# Patient Record
Sex: Female | Born: 1970 | Race: White | Hispanic: No | Marital: Married | Smoking: Never smoker
Health system: Southern US, Community
[De-identification: ages and names within clinical notes are randomized; demographics above are authoritative.]

## PROBLEM LIST (undated history)

## (undated) DIAGNOSIS — IMO0002 Reserved for concepts with insufficient information to code with codable children: Secondary | ICD-10-CM

## (undated) DIAGNOSIS — G35 Multiple sclerosis: Secondary | ICD-10-CM

## (undated) DIAGNOSIS — Z8739 Personal history of other diseases of the musculoskeletal system and connective tissue: Secondary | ICD-10-CM

## (undated) DIAGNOSIS — M329 Systemic lupus erythematosus, unspecified: Secondary | ICD-10-CM

## (undated) HISTORY — PX: TEMPOROMANDIBULAR JOINT SURGERY: SHX35

## (undated) HISTORY — PX: CERVICAL FUSION: SHX112

---

## 2017-07-03 ENCOUNTER — Emergency Department (HOSPITAL_COMMUNITY): Payer: Medicaid Other

## 2017-07-03 ENCOUNTER — Other Ambulatory Visit: Payer: Self-pay

## 2017-07-03 ENCOUNTER — Encounter (HOSPITAL_COMMUNITY): Payer: Self-pay

## 2017-07-03 ENCOUNTER — Emergency Department (HOSPITAL_COMMUNITY)
Admission: EM | Admit: 2017-07-03 | Discharge: 2017-07-03 | Disposition: A | Discharge status: Home / Self Care | Payer: Medicaid Other | Attending: Emergency Medicine | Admitting: Emergency Medicine

## 2017-07-03 DIAGNOSIS — Z79899 Other long term (current) drug therapy: Secondary | ICD-10-CM | POA: Diagnosis not present

## 2017-07-03 DIAGNOSIS — M5431 Sciatica, right side: Secondary | ICD-10-CM | POA: Insufficient documentation

## 2017-07-03 DIAGNOSIS — M79604 Pain in right leg: Secondary | ICD-10-CM | POA: Diagnosis present

## 2017-07-03 HISTORY — DX: Systemic lupus erythematosus, unspecified: M32.9

## 2017-07-03 HISTORY — DX: Reserved for concepts with insufficient information to code with codable children: IMO0002

## 2017-07-03 HISTORY — DX: Personal history of other diseases of the musculoskeletal system and connective tissue: Z87.39

## 2017-07-03 LAB — COMPREHENSIVE METABOLIC PANEL
ALT: 16 U/L (ref 14–54)
ANION GAP: 7 (ref 5–15)
AST: 19 U/L (ref 15–41)
Albumin: 4.2 g/dL (ref 3.5–5.0)
Alkaline Phosphatase: 42 U/L (ref 38–126)
BUN: 11 mg/dL (ref 6–20)
CALCIUM: 9.4 mg/dL (ref 8.9–10.3)
CHLORIDE: 108 mmol/L (ref 101–111)
CO2: 25 mmol/L (ref 22–32)
Creatinine, Ser: 0.55 mg/dL (ref 0.44–1.00)
Glucose, Bld: 93 mg/dL (ref 65–99)
Potassium: 3.4 mmol/L — ABNORMAL LOW (ref 3.5–5.1)
SODIUM: 140 mmol/L (ref 135–145)
Total Bilirubin: 1.3 mg/dL — ABNORMAL HIGH (ref 0.3–1.2)
Total Protein: 7.3 g/dL (ref 6.5–8.1)

## 2017-07-03 LAB — CBC WITH DIFFERENTIAL/PLATELET
Basophils Absolute: 0 10*3/uL (ref 0.0–0.1)
Basophils Relative: 0 %
EOS ABS: 0.1 10*3/uL (ref 0.0–0.7)
EOS PCT: 1 %
HCT: 39.7 % (ref 36.0–46.0)
Hemoglobin: 13.4 g/dL (ref 12.0–15.0)
LYMPHS ABS: 1.8 10*3/uL (ref 0.7–4.0)
Lymphocytes Relative: 31 %
MCH: 29.3 pg (ref 26.0–34.0)
MCHC: 33.8 g/dL (ref 30.0–36.0)
MCV: 86.9 fL (ref 78.0–100.0)
MONO ABS: 0.5 10*3/uL (ref 0.1–1.0)
MONOS PCT: 8 %
Neutro Abs: 3.5 10*3/uL (ref 1.7–7.7)
Neutrophils Relative %: 60 %
PLATELETS: 304 10*3/uL (ref 150–400)
RBC: 4.57 MIL/uL (ref 3.87–5.11)
RDW: 12.4 % (ref 11.5–15.5)
WBC: 5.8 10*3/uL (ref 4.0–10.5)

## 2017-07-03 MED ORDER — CYCLOBENZAPRINE HCL 10 MG PO TABS: 10.0000 mg | ORAL_TABLET | Freq: Three times a day (TID) | ORAL | 0 refills | Status: AC | PRN

## 2017-07-03 MED ORDER — LIDOCAINE 5 % EX PTCH: 1.0000 | MEDICATED_PATCH | CUTANEOUS | 0 refills | Status: DC

## 2017-07-03 MED ORDER — HYDROCODONE-ACETAMINOPHEN 5-325 MG PO TABS: 1.0000 | ORAL_TABLET | Freq: Four times a day (QID) | ORAL | 0 refills | Status: DC | PRN

## 2017-07-03 MED ORDER — DIAZEPAM 5 MG/ML IJ SOLN
5.0000 mg | Freq: Once | INTRAMUSCULAR | Status: AC
  Administered 2017-07-03: 5 mg via INTRAVENOUS
  Filled 2017-07-03: qty 2

## 2017-07-03 MED ORDER — DICLOFENAC SODIUM 1 % TD GEL: 2.0000 g | Freq: Four times a day (QID) | TRANSDERMAL | 0 refills | Status: AC

## 2017-07-03 MED ORDER — HYDROMORPHONE HCL 1 MG/ML IJ SOLN
1.0000 mg | Freq: Once | INTRAMUSCULAR | Status: AC
  Administered 2017-07-03: 1 mg via INTRAVENOUS
  Filled 2017-07-03: qty 1

## 2017-07-03 NOTE — ED Triage Notes (Signed)
Pt reports 10/10 right leg pain x6 months. Pt reports that she has been evaluated by her PCP and referred for an MRI, but was denied due to having Medicaid. Pt reports Rx meds have not help relieve her pain. Pt reports utiliizing cane for ambulatory assistance. Pt A+OX4, tearful, speaking in complete sentences.

## 2017-07-03 NOTE — ED Provider Notes (Signed)
[facility]-EMERGENCY DEPT Provider Note   CSN: [id number] Arrival date & time: 07/03/17  1551     History   Chief Complaint Chief Complaint  Patient presents with  . Leg Pain    Right    HPI Suzanne Fisher is a 46 y.o. female history of lupus on Plaquenil, here presenting with back pain, right leg numbness.  Patient states that she has been having lower back pain for the last 4-6 months.  States that it initially started in her lower back and now started to radiate to her right leg.  Patient states that over the last month, the numbness in the right leg got worse and she has pain every time she bears weight on the leg.  She has to use a cane to help her walk due to pain.  States that whenever she coughs it hurts.  She denies trouble urinating or weakness.  She has seen her primary care doctor and was prescribed gabapentin, tramadol with no relief of her pain.  She had 3 cortisone injections but was unable to control her pain either.  She denies any fevers or chills. 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   The history is provided by the patient.    Past Medical History:  Diagnosis Date  . H/O degenerative disc disease   . Lupus     There are no active problems to display for this patient.   History reviewed. No pertinent surgical history.  OB History    No data available       Home Medications    Prior to Admission medications   Medication Sig Start Date End Date Taking? Authorizing Provider  buPROPion (WELLBUTRIN XL) 150 MG 24 hr tablet TAKE 1 TABLET BY MOUTH EVERY DAY IN THE MORNING 04/12/17  Yes [provider]  fluocinonide (LIDEX) 0.05 % external solution Apply 1 application topically daily.  04/16/17 04/16/18 Yes [provider]  gabapentin (NEURONTIN) 300 MG capsule Take 300 mg by mouth 3 (three) times daily. 05/21/17  Yes [provider]  traMADol (ULTRAM-ER) 100 MG 24 hr tablet Take  100 mg by mouth daily as needed for pain.   Yes [provider]  traZODone (DESYREL) 100 MG tablet TAKE 1 TABLET BY MOUTH EVERY DAY AT NIGHT 04/12/17  Yes [provider]  diclofenac (VOLTAREN) 75 MG EC tablet TAKE 1 TABLET BY MOUTH TWICE A DAY FOR 14 DAYS 06/18/17   [provider]  DULoxetine (CYMBALTA) 60 MG capsule Take by mouth. 04/12/17   [provider]  HYDROcodone-acetaminophen (NORCO/VICODIN) 5-325 MG tablet Take 1 tablet by mouth every 6 (six) hours as needed. for pain 06/06/17   [provider]  HYSINGLA ER 40 MG T24A TAKE 1 TABLET BY MOUTH EVERY 24 HOURS AS NEEDED FOR PAIN 06/18/17   [provider]  ibuprofen (ADVIL,MOTRIN) 600 MG tablet TAKE ONE TABLET BY MOUTH EVERY 6 HOURS NOT TO EXCEED 3200MG  PER DAY 06/06/17   [provider]  ibuprofen (ADVIL,MOTRIN) 800 MG tablet Take 800 mg by mouth every 8 (eight) hours. 05/24/17   [provider]  omeprazole (PRILOSEC) 40 MG capsule TAKE 1 CAPSULE BY MOUTH EVERY DAY IN THE MORNING 30 MINUTES BEFORE BREAKFAST 04/08/17   [provider]  oxyCODONE-acetaminophen (PERCOCET) 7.5-325 MG tablet TAKE 1 TABLET BY MOUTH EVERY 4 TO 6 HOURS AS NEEDED 06/18/17   [provider]  traMADol (ULTRAM) 50 MG tablet TAKE 1/2 -  2 TABLETS BY MOUTH EVERY 8 - 12 HOURS AS NEEDED FOR BREAKTHROUGH PAIN 06/25/17   [provider]    Family History History reviewed. No pertinent family history.  Social History Social History   Tobacco Use  . Smoking status: Never Smoker  . Smokeless tobacco: Never Used  Substance Use Topics  . Alcohol use: No    Frequency: Never  . Drug use: No     Allergies   Patient has no known allergies.   Review of Systems Review of Systems  Musculoskeletal: Positive for back pain.  All other systems reviewed and are negative.    Physical Exam Updated Vital Signs BP 118/77 (BP Location: Right Arm)   Pulse 76   Temp 98.1 F (36.7  C) (Oral)   Resp 18   SpO2 99%   Physical Exam  Constitutional: She is oriented to person, place, and time.  Uncomfortable   HENT:  Head: Normocephalic.  Mouth/Throat: Oropharynx is clear and moist.  Bell's palsy R side (chronic per patient)  Eyes: Conjunctivae and EOM are normal. Pupils are equal, round, and reactive to light.  Neck: Normal range of motion. Neck supple.  Cardiovascular: Normal rate, regular rhythm and normal heart sounds.  Pulmonary/Chest: Effort normal and breath sounds normal. No stridor. No respiratory distress.  Abdominal: Soft. Bowel sounds are normal. She exhibits no distension. There is no tenderness. There is no guarding.  Musculoskeletal: Normal range of motion.  Neurological: She is alert and oriented to person, place, and time.  Dec sensation lateral aspect R leg. No saddle anesthesia. + straight leg raise R side. Difficult to assess strength on the R leg due to pain. Strength and sensation nl L leg   Skin: Skin is warm.  Psychiatric: She has a normal mood and affect.  Nursing note and vitals reviewed.    ED Treatments / Results  Labs (all labs ordered are listed, but only abnormal results are displayed) Labs Reviewed  COMPREHENSIVE METABOLIC PANEL - Abnormal; Notable for the following components:      Result Value   Potassium 3.4 (*)    Total Bilirubin 1.3 (*)    All other components within normal limits  CBC WITH DIFFERENTIAL/PLATELET    EKG  EKG Interpretation None       Radiology Mr Lumbar Spine Wo Contrast  Result Date: 07/03/2017 CLINICAL DATA:  Back pain radiating to the right lower extremity EXAM: MRI LUMBAR SPINE WITHOUT CONTRAST TECHNIQUE: Multiplanar, multisequence MR imaging of the lumbar spine was performed. No intravenous contrast was administered. COMPARISON:  None. FINDINGS: Segmentation:  Standard. Alignment:  Physiologic. Vertebrae:  No fracture, evidence of discitis, or bone lesion. Conus medullaris and cauda equina:  Conus extends to the L1 level. Conus and cauda equina appear normal. Paraspinal and other soft tissues: Normal visualized retroperitoneal and paraspinal soft tissues. Disc levels: At L4-L5, there is a central disc protrusion that narrows the left lateral recess. There is no central spinal canal or right lateral recess stenosis. There is mild narrowing of the left neural foramen. The other visualized disc spaces, from T11-T12 down, are normal. IMPRESSION: 1. No spinal canal stenosis or finding to explain the reported right-sided radiculopathy. 2. Small central/left subarticular L4-L5 disc protrusion narrowing the left lateral recess, which could serve as a source of left L5 radiculopathy. There is also mild left foraminal stenosis at this level, which could cause left L4 radiculopathy. Electronically Signed   By: Deatra RobinsonKevin  Herman M.D.   On: 07/03/2017 21:59  Procedures Procedures (including critical care time)  Medications Ordered in ED Medications  HYDROmorphone (DILAUDID) injection 1 mg (1 mg Intravenous Given 07/03/17 2105)  diazepam (VALIUM) injection 5 mg (5 mg Intravenous Given 07/03/17 2106)     Initial Impression / Assessment and Plan / ED Course  I have reviewed the triage vital signs and the nursing notes.  Pertinent labs & imaging results that were available during my care of the patient were reviewed by me and considered in my medical decision making (see chart for details).     Suzanne Fisher is a 46 y.o. female here with R leg pain, numbness, back pain. Likely sciatica on the right side. She has some decreased sensation lateral aspect R leg so will get MRI lumbar spine to assess. Will give pain meds, muscle relaxants   10:20 PM Labs unremarkable. MRI showed L4-L5 disc protrusion on the left side. However, her symptoms are on the right side. Her pain improved with pain meds. She has neurosurgery follow up on 12/26. Will dc home with vicodin, flexeril, prn diclofenac or lidocaine  patch. I encouraged her to request a disc to be reviewed by her surgeon. Had tried steroid pack before and it didn't help so doesn't want another course of steroids.   Final Clinical Impressions(s) / ED Diagnoses   Final diagnoses:  None    ED Discharge Orders    None       Charlynne PanderYao, David Hsienta, MD 07/03/17 2223

## 2017-07-03 NOTE — ED Notes (Signed)
Rn To collect labs.

## 2017-07-03 NOTE — ED Notes (Signed)
Patient transported to MRI 

## 2017-07-03 NOTE — Discharge Instructions (Signed)
Take motrin for pain.   Take vicodin for severe pain.   Take flexeril for muscle spasms.   Try diclofenac or lidocaine patch for pain.   Call medical records to request a disc and show it to your surgeon.   See your primary care doctor  Return to ER if you have worse back pain, numbness, weakness, trouble urinating.

## 2017-08-25 ENCOUNTER — Emergency Department (HOSPITAL_COMMUNITY): Payer: Medicaid Other

## 2017-08-25 ENCOUNTER — Encounter (HOSPITAL_COMMUNITY): Payer: Self-pay

## 2017-08-25 ENCOUNTER — Other Ambulatory Visit: Payer: Self-pay

## 2017-08-25 ENCOUNTER — Emergency Department (HOSPITAL_COMMUNITY)
Admission: EM | Admit: 2017-08-25 | Discharge: 2017-08-25 | Disposition: A | Discharge status: Home / Self Care | Payer: Medicaid Other | Attending: Emergency Medicine | Admitting: Emergency Medicine

## 2017-08-25 DIAGNOSIS — Z79899 Other long term (current) drug therapy: Secondary | ICD-10-CM | POA: Diagnosis not present

## 2017-08-25 DIAGNOSIS — B349 Viral infection, unspecified: Secondary | ICD-10-CM

## 2017-08-25 DIAGNOSIS — R42 Dizziness and giddiness: Secondary | ICD-10-CM | POA: Diagnosis present

## 2017-08-25 HISTORY — DX: Multiple sclerosis: G35

## 2017-08-25 LAB — COMPREHENSIVE METABOLIC PANEL
ALK PHOS: 46 U/L (ref 38–126)
ALT: 16 U/L (ref 14–54)
AST: 22 U/L (ref 15–41)
Albumin: 3.7 g/dL (ref 3.5–5.0)
Anion gap: 10 (ref 5–15)
BILIRUBIN TOTAL: 0.6 mg/dL (ref 0.3–1.2)
BUN: 12 mg/dL (ref 6–20)
CALCIUM: 8.8 mg/dL — AB (ref 8.9–10.3)
CO2: 28 mmol/L (ref 22–32)
CREATININE: 0.72 mg/dL (ref 0.44–1.00)
Chloride: 101 mmol/L (ref 101–111)
GFR calc non Af Amer: >60 mL/min (ref >60)
Glucose, Bld: 109 mg/dL — ABNORMAL HIGH (ref 65–99)
Potassium: 3.2 mmol/L — ABNORMAL LOW (ref 3.5–5.1)
Sodium: 139 mmol/L (ref 135–145)
Total Protein: 7.3 g/dL (ref 6.5–8.1)

## 2017-08-25 LAB — CBC WITH DIFFERENTIAL/PLATELET
Basophils Absolute: 0 10*3/uL (ref 0.0–0.1)
Basophils Relative: 0 %
EOS PCT: 0 %
Eosinophils Absolute: 0 10*3/uL (ref 0.0–0.7)
HCT: 40.3 % (ref 36.0–46.0)
HEMOGLOBIN: 13.2 g/dL (ref 12.0–15.0)
LYMPHS ABS: 1.2 10*3/uL (ref 0.7–4.0)
LYMPHS PCT: 26 %
MCH: 29.1 pg (ref 26.0–34.0)
MCHC: 32.8 g/dL (ref 30.0–36.0)
MCV: 88.8 fL (ref 78.0–100.0)
Monocytes Absolute: 0.7 10*3/uL (ref 0.1–1.0)
Monocytes Relative: 15 %
Neutro Abs: 2.6 10*3/uL (ref 1.7–7.7)
Neutrophils Relative %: 59 %
PLATELETS: 214 10*3/uL (ref 150–400)
RBC: 4.54 MIL/uL (ref 3.87–5.11)
RDW: 12.6 % (ref 11.5–15.5)
WBC: 4.4 10*3/uL (ref 4.0–10.5)

## 2017-08-25 LAB — I-STAT TROPONIN, ED: TROPONIN I, POC: 0 ng/mL (ref 0.00–0.08)

## 2017-08-25 LAB — I-STAT CG4 LACTIC ACID, ED: LACTIC ACID, VENOUS: 1.42 mmol/L (ref 0.5–1.9)

## 2017-08-25 LAB — URINALYSIS, ROUTINE W REFLEX MICROSCOPIC
BILIRUBIN URINE: NEGATIVE
Glucose, UA: NEGATIVE mg/dL
Ketones, ur: NEGATIVE mg/dL
Leukocytes, UA: NEGATIVE
Nitrite: NEGATIVE
PH: 5 (ref 5.0–8.0)
Protein, ur: NEGATIVE mg/dL
SPECIFIC GRAVITY, URINE: 1.014 (ref 1.005–1.030)

## 2017-08-25 LAB — TSH: TSH: 1.702 u[IU]/mL (ref 0.350–4.500)

## 2017-08-25 MED ORDER — SODIUM CHLORIDE 0.9 % IV BOLUS (SEPSIS)
1000.0000 mL | Freq: Once | INTRAVENOUS | Status: AC
  Administered 2017-08-25: 1000 mL via INTRAVENOUS

## 2017-08-25 MED ORDER — BENZONATATE 100 MG PO CAPS: 100.0000 mg | ORAL_CAPSULE | Freq: Three times a day (TID) | ORAL | 0 refills | Status: AC

## 2017-08-25 MED ORDER — ONDANSETRON HCL 4 MG PO TABS: 4.0000 mg | ORAL_TABLET | Freq: Three times a day (TID) | ORAL | 0 refills | Status: AC | PRN

## 2017-08-25 MED ORDER — POTASSIUM CHLORIDE CRYS ER 20 MEQ PO TBCR
40.0000 meq | EXTENDED_RELEASE_TABLET | Freq: Once | ORAL | Status: AC
  Administered 2017-08-25: 40 meq via ORAL
  Filled 2017-08-25: qty 2

## 2017-08-25 MED ORDER — BENZONATATE 100 MG PO CAPS
100.0000 mg | ORAL_CAPSULE | Freq: Once | ORAL | Status: AC
  Administered 2017-08-25: 100 mg via ORAL
  Filled 2017-08-25: qty 1

## 2017-08-25 NOTE — ED Triage Notes (Signed)
Pt reports dx of pneumonia in January. Pt reports tx with abx. Pt reports that she "never really recovered" [facility]. Pt states " they did a xray and saw fluid on the right" Pt reports they administered abx and cough meds. Pt reports that she has been feeling dizzy and weak this morning. Pt reports she feels faint.

## 2017-08-25 NOTE — ED Provider Notes (Signed)
[facility]-EMERGENCY DEPT Provider Note   CSN: [id number] Arrival date & time: 08/25/17  1439     History   Chief Complaint Chief Complaint  Patient presents with  . Dizziness  . Weakness    HPI Suzanne Fisher is a 47 y.o. female.  HPI   47 year old female with history of lupus, multiple sclerosis who was diagnosed with pneumonia in January presenting here complaining of dizzy and weakness.  Patient report a month ago she was diagnosed with influenza.  She was on medication for that.  She got mildly better but for the past 2 weeks symptom has progressively become worse.  She endorsed chills, headache, dry cough, sinus congestion, decrease in appetite, loss of taste buds, generalized weakness, fatigue, having dizziness and lightheadedness.  States her cough is causing a lot of chest pain and upper back pain.  She is having difficulty sleeping, and has no appetite.  She was seen at the urgent care yesterday for her symptoms.  States that a repeat flu test is negative, strep test was negative, and a chest x-ray performed and patient was told that she had fluid in her lung.  She was given antibiotic, and cough medication.  States that her symptoms did not improve and she feels much worse today.  Admits to history of lupus and currently on Plaquenil, also admits to history of MS but has not had a flare in quite a while.  No recent travel, no prior history of PE or DVT.  No urinary complaint.  No bowel bladder complaint.  Past Medical History:  Diagnosis Date  . H/O degenerative disc disease   . Lupus   . MS (multiple sclerosis) (HCC)     There are no active problems to display for this patient.   Past Surgical History:  Procedure Laterality Date  . CERVICAL FUSION    . CESAREAN SECTION    . TEMPOROMANDIBULAR JOINT SURGERY      OB History    No data available       Home Medications    Prior to Admission medications   Medication Sig Start Date End  Date Taking? Authorizing Provider  buPROPion (WELLBUTRIN XL) 150 MG 24 hr tablet TAKE 1 TABLET BY MOUTH EVERY DAY IN THE MORNING 04/12/17   [provider]  cyclobenzaprine (FLEXERIL) 10 MG tablet Take 1 tablet (10 mg total) by mouth 3 (three) times daily as needed for muscle spasms. 07/03/17   Charlynne Pander, MD  diclofenac (VOLTAREN) 75 MG EC tablet TAKE 1 TABLET BY MOUTH TWICE A DAY FOR 14 DAYS 06/18/17   [provider]  diclofenac sodium (VOLTAREN) 1 % GEL Apply 2 g topically 4 (four) times daily. 07/03/17   Charlynne Pander, MD  DULoxetine (CYMBALTA) 60 MG capsule Take by mouth. 04/12/17   [provider]  fluocinonide (LIDEX) 0.05 % external solution Apply 1 application topically daily.  04/16/17 04/16/18  [provider]  gabapentin (NEURONTIN) 300 MG capsule Take 300 mg by mouth 3 (three) times daily. 05/21/17   [provider]  HYDROcodone-acetaminophen (NORCO/VICODIN) 5-325 MG tablet Take 1 tablet by mouth every 6 (six) hours as needed. for pain 06/06/17   [provider]  HYDROcodone-acetaminophen (NORCO/VICODIN) 5-325 MG tablet Take 1 tablet by mouth every 6 (six) hours as needed. 07/03/17   Charlynne Pander, MD  [facility] ER 40 MG T24A TAKE 1 TABLET BY MOUTH EVERY 24 HOURS AS NEEDED FOR PAIN 06/18/17   [provider]  ibuprofen (ADVIL,MOTRIN) 600 MG tablet TAKE ONE TABLET BY MOUTH EVERY 6 HOURS NOT TO EXCEED 3200MG  PER DAY 06/06/17   [provider]  ibuprofen (ADVIL,MOTRIN) 800 MG tablet Take 800 mg by mouth every 8 (eight) hours. 05/24/17   [provider]  lidocaine (LIDODERM) 5 % Place 1 patch onto the skin daily. Remove & Discard patch within 12 hours or as directed by MD 07/03/17   Charlynne Pander, MD  omeprazole (PRILOSEC) 40 MG capsule TAKE 1 CAPSULE BY MOUTH EVERY DAY IN THE MORNING 30 MINUTES BEFORE BREAKFAST 04/08/17   [provider]  oxyCODONE-acetaminophen (PERCOCET) 7.5-325 MG  tablet TAKE 1 TABLET BY MOUTH EVERY 4 TO 6 HOURS AS NEEDED 06/18/17   [provider]  traMADol (ULTRAM) 50 MG tablet TAKE 1/2 - 2 TABLETS BY MOUTH EVERY 8 - 12 HOURS AS NEEDED FOR BREAKTHROUGH PAIN 06/25/17   [provider]  traMADol (ULTRAM-ER) 100 MG 24 hr tablet Take 100 mg by mouth daily as needed for pain.    [provider]  traZODone (DESYREL) 100 MG tablet TAKE 1 TABLET BY MOUTH EVERY DAY AT NIGHT 04/12/17   [provider]    Family History History reviewed. No pertinent family history.  Social History Social History   Tobacco Use  . Smoking status: Never Smoker  . Smokeless tobacco: Never Used  Substance Use Topics  . Alcohol use: No    Frequency: Never  . Drug use: No     Allergies   Patient has no known allergies.   Review of Systems Review of Systems  All other systems reviewed and are negative.    Physical Exam Updated Vital Signs BP 103/81 (BP Location: Right Arm)   Pulse (!) 103   Temp 97.6 F (36.4 C) (Oral)   Resp 20   Ht 5\' 4"  (1.626 m)   Wt 51.3 kg (113 lb)   SpO2 100%   BMI 19.40 kg/m    Physical Exam  Constitutional: She is oriented to person, place, and time. She appears well-developed and well-nourished. No distress.  Patient appears deconditioned, talking slowly, moving slowly, but nontoxic  HENT:  Head: Atraumatic.  Ears: Normal TMs bilaterally Nose: Normal nares Throat: Uvula is midline no tonsillar enlargement or exudates, tongue is dry  Eyes: Conjunctivae and EOM are normal. Pupils are equal, round, and reactive to light.  Neck: Normal range of motion. Neck supple.  No nuchal rigidity  Cardiovascular:  Mild tachycardia without murmur rubs gallops  Pulmonary/Chest: Effort normal and breath sounds normal. No stridor. No respiratory distress. She has no wheezes. She has no rales. She exhibits tenderness (Mild tenderness to anterior chest wall on palpation).  Abdominal: Soft. She exhibits no  distension. There is no tenderness.  Musculoskeletal:  Strength are equal throughout with poor effort  Neurological: She is alert and oriented to person, place, and time.  Skin: No rash noted.  Psychiatric: She has a normal mood and affect.  Nursing note and vitals reviewed.    ED Treatments / Results  Labs (all labs ordered are listed, but only abnormal results are displayed) Labs Reviewed  COMPREHENSIVE METABOLIC PANEL - Abnormal; Notable for the following components:      Result Value   Potassium 3.2 (*)    Glucose, Bld 109 (*)    Calcium 8.8 (*)    All other components within normal limits  URINALYSIS, ROUTINE W REFLEX MICROSCOPIC - Abnormal; Notable for the following components:   APPearance HAZY (*)  Hgb urine dipstick SMALL (*)    Bacteria, UA RARE (*)    Squamous Epithelial / LPF 6-30 (*)    All other components within normal limits  CBC WITH DIFFERENTIAL/PLATELET  TSH  T4, FREE  I-STAT CG4 LACTIC ACID, ED  I-STAT TROPONIN, ED    EKG  EKG Interpretation  Date/Time:  Sunday August 25 2017 16:03:10 EST Ventricular Rate:  96 PR Interval:    QRS Duration: 106 QT Interval:  335 QTC Calculation: 424 R Axis:   48 Text Interpretation:  Sinus rhythm Consider anterior infarct Confirmed by Messick, Peter ([id number]) on 08/25/2017 4:18:05 PM       Radiology Dg Chest 2 View  Result Date: 08/25/2017 CLINICAL DATA:  Diagnosed with pneumonia in January. Continued symptoms. EXAM: CHEST  2 VIEW COMPARISON:  None. FINDINGS: The heart, hila, and mediastinum are normal. No pneumothorax. No nodules or masses. No focal infiltrates. IMPRESSION: No active cardiopulmonary disease. Electronically Signed   By: David  Williams III M.D   On: 08/25/2017 18:46    Procedures Procedures (including critical care time)  Medications Ordered in ED Medications  sodium chloride 0.9 % bolus 1,000 mL (0 mLs Intravenous Stopped 08/25/17 1730)  benzonatate (TESSALON) capsule 100 mg (100 mg Oral  Given 08/25/17 1543)  potassium chloride SA (K-DUR,KLOR-CON) CR tablet 40 mEq (40 mEq Oral Given 08/25/17 1723)  sodium chloride 0.9 % bolus 1,000 mL (1,000 mLs Intravenous New Bag/Given 08/25/17 1753)     Initial Impression / Assessment and Plan / ED Course  I have reviewed the triage vital signs and the nursing notes.  Pertinent labs & imaging results that were available during my care of the patient were reviewed by me and considered in my medical decision making (see chart for details).     BP 125/85 (BP Location: Left Arm)   Pulse 97   Temp 97.6 F (36.4 C) (Oral)   Resp 18   Ht 5\' 4" (1.626 m)   Wt 51.3 kg (113 lb)   SpO2 100%   BMI 19.40 kg/m    Final Clinical Impressions(s) / ED Diagnoses   Final diagnoses:  Viral illness    ED Discharge Orders        Ordered    benzonatate (TESSALON) 100 MG capsule  Every 8 hours     08/25/17 1936    ondansetron (ZOFRAN) 4 MG tablet  Every 8 hours PRN     02 /03/19 1936     3:22 PM Patient here due to lightheadedness and generalized weakness.  She was treated for the flu a month ago and now being treated for potential pneumonia with antibiotic.  Her symptoms seems to be related to her recent sickness.  She is mildly tachycardic.  I have low suspicion for PE causing her symptoms.  Symptoms not consistent with ACS.  Workup initiated, will provide symptomatic treatment.  5:30 PM Patient remains tachycardic despite receiving 1 L of IV fluid.  She report no improvement.  She does have history of thyroid disease, will check thyroid level, will continue with IV fluid hydration.  Her labs otherwise reassuring, mild hypokalemia, potassium supplementation given.  7:10 PM EKG and troponins are reassuring, urinalysis without signs of urinary tract infection, lactic acid level is normal, normal TSH, normal WBC as well as H&H, aside from mild hypokalemia, however electrolyte panels are reassuring, chest x-ray without any evidence of fluid collection  or pneumonia.  Patient's tachycardia has resolved with IV fluid.  Patient felt better.  Encourage patient  to follow-up outpatient with her PCP for further management.  Return precautions discussed.  Fayrene Helper, PA-C 08/25/17 1938  Wynetta Fines, MD 08/25/17 [phone]

## 2017-08-25 NOTE — ED Notes (Signed)
Pt. Unable to urinate at this time. Will collect urine when pt.voids. Nurse aware. 

## 2017-08-26 LAB — T4, FREE: Free T4: 1 ng/dL (ref 0.61–1.12)

## 2018-12-08 IMAGING — MR MR LUMBAR SPINE W/O CM
4 of 5 series · 19 of 48 positions shown · non-contrast
Comparison: None.

CLINICAL DATA: Back pain radiating to the right lower extremity

EXAM:
MRI LUMBAR SPINE WITHOUT CONTRAST
TECHNIQUE: Multiplanar, multisequence MR imaging of the lumbar spine was
performed. No intravenous contrast was administered.

[Series 3: T1 · sagittal · 4.0mm · 0.51mm/px · 3 of 12 slices shown (1 of 2)]
[im 3/12]
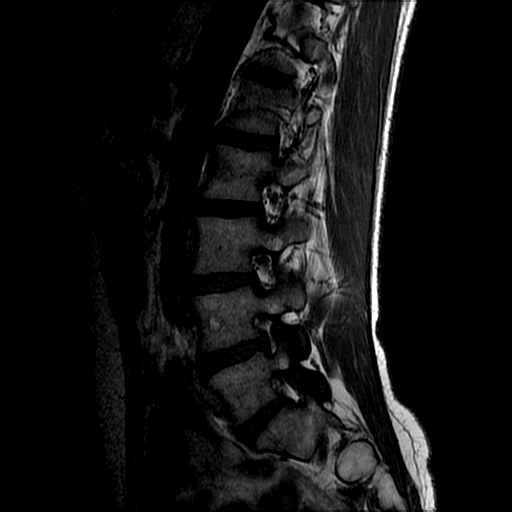
[im 7/12]
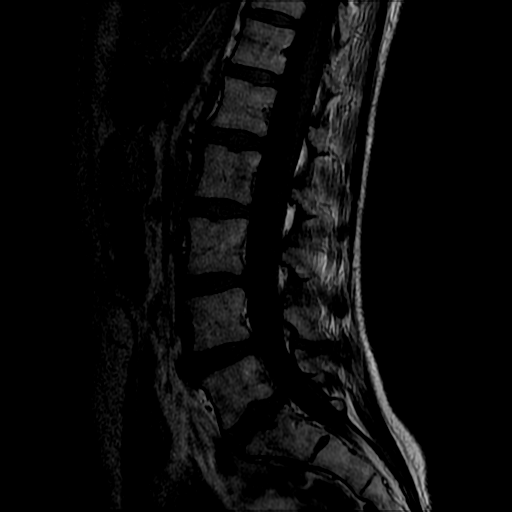
[im 12/12]
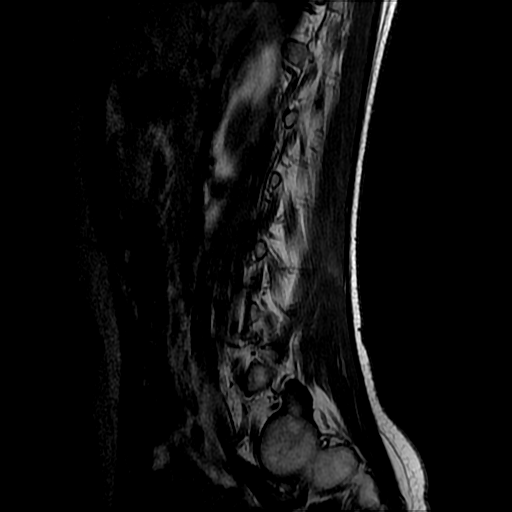

[Series 4: T2 · sagittal · 4.0mm · 0.51mm/px · 6 of 12 slices shown (1 of 2)]
[im 1/12]
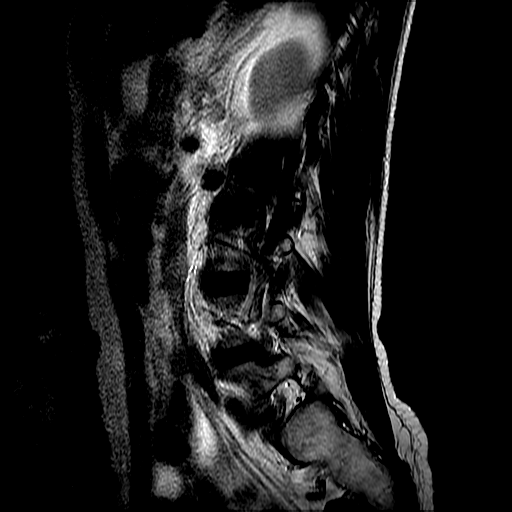
[im 3/12]
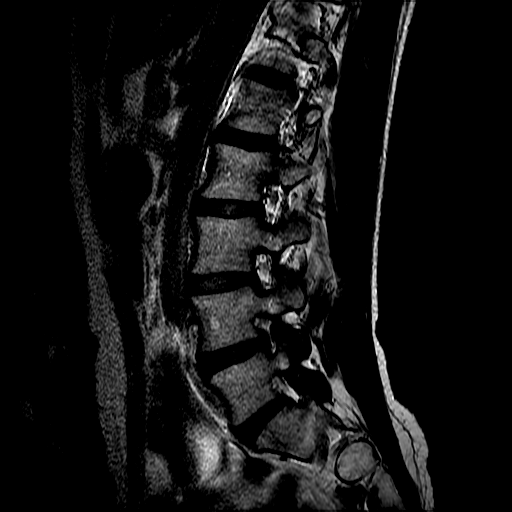
[im 5/12]
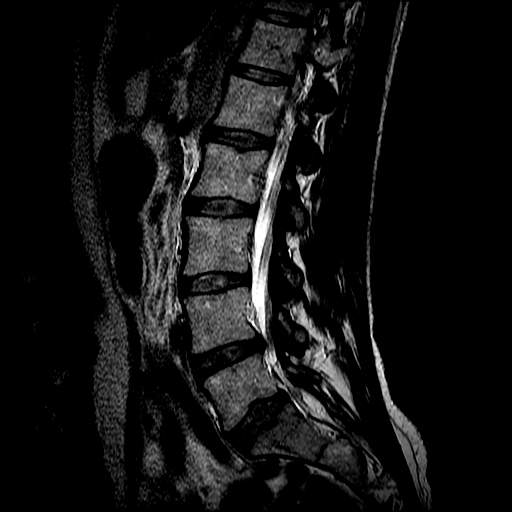
[im 7/12]
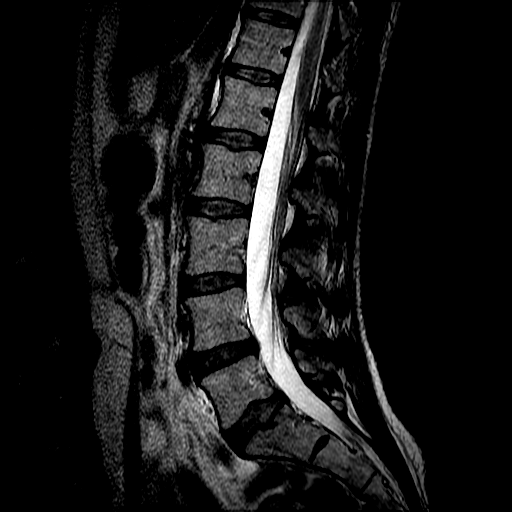
[im 9/12]
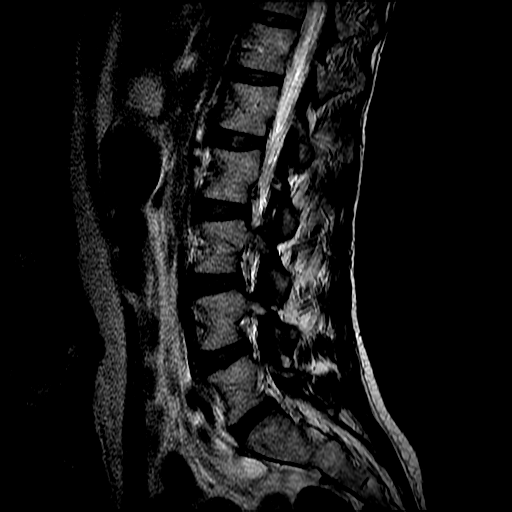
[im 12/12]
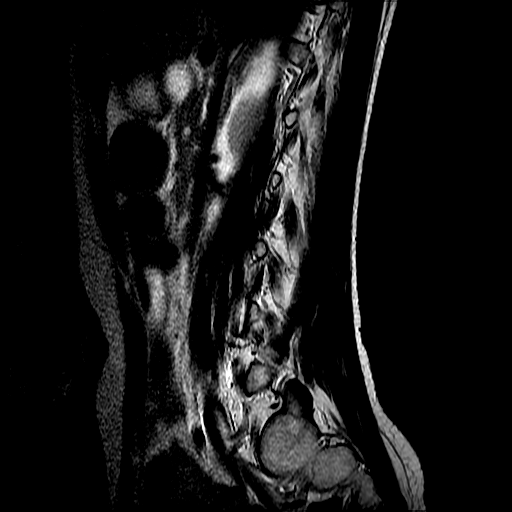

[Series 7: T2 · axial · 4.0mm · 0.39mm/px · z∈[-122,+34]mm · 7 of 32 slices shown (2 of 2)]
[im 1/32]
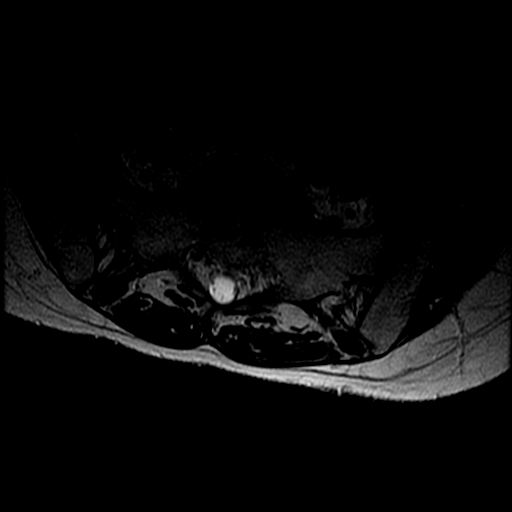
[im 5/32]
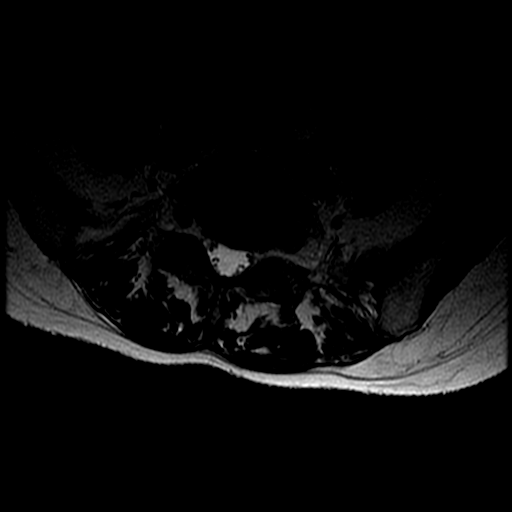
[im 9/32]
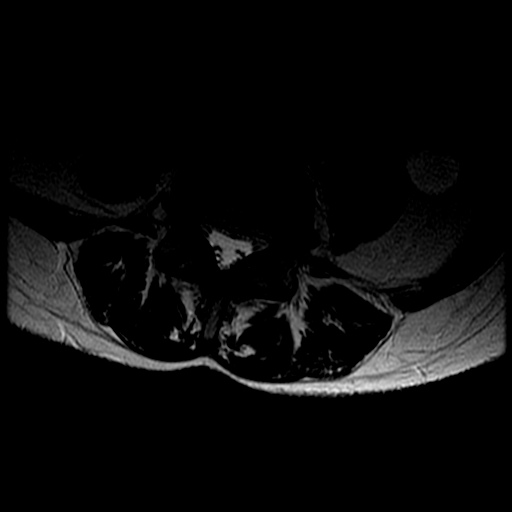
[im 14/32]
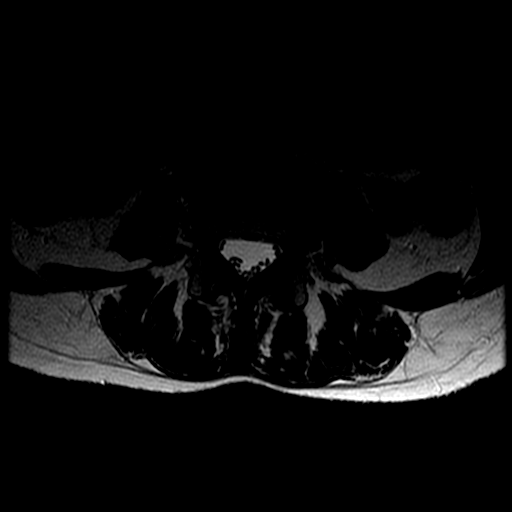
[im 16/32]
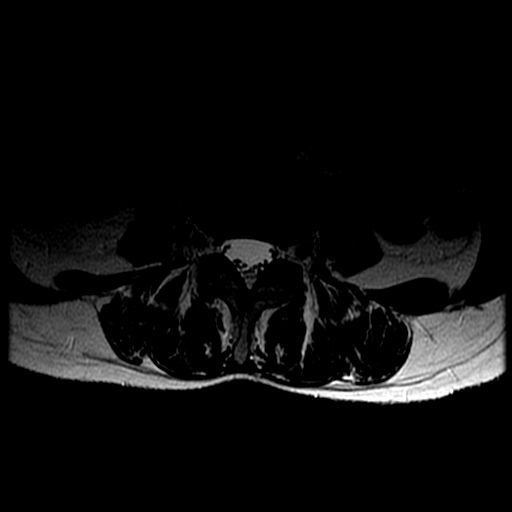
[im 18/32]
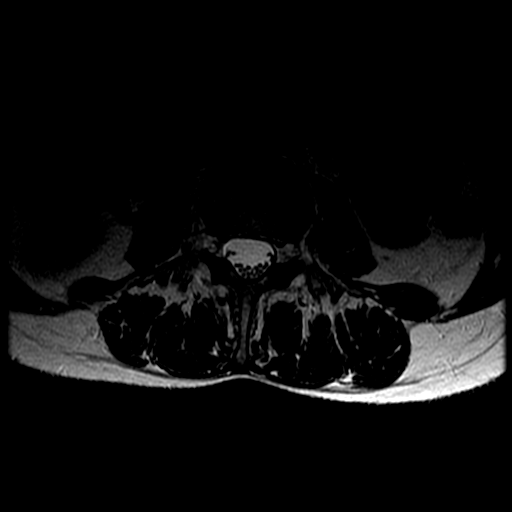
[im 27/32]
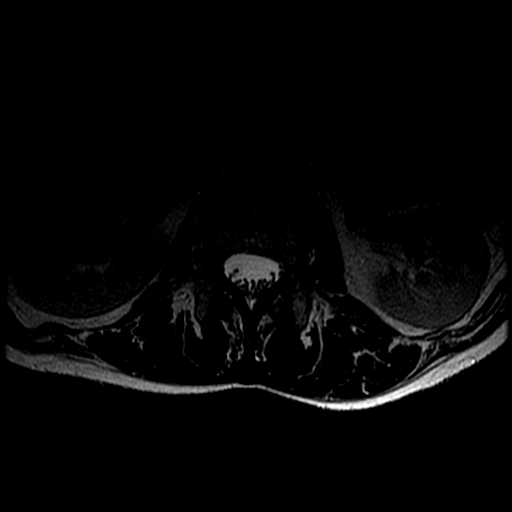

[Series 8: T1 · axial · 4.0mm · 0.39mm/px · z∈[-103,+34]mm · 3 of 32 slices shown (2 of 2)]
[im 5/32]
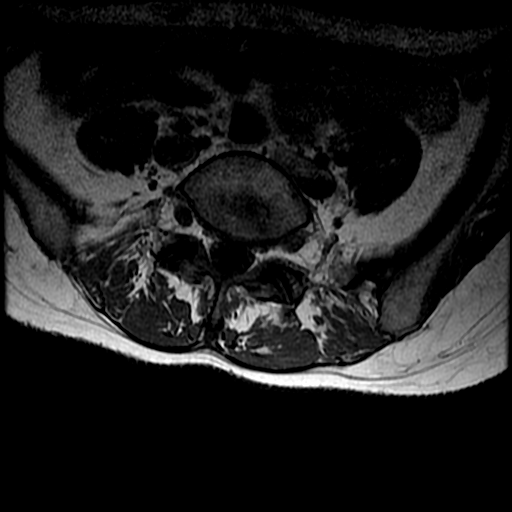
[im 16/32]
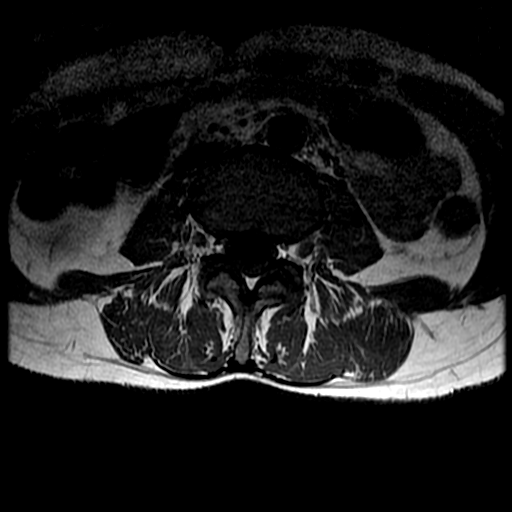
[im 27/32]
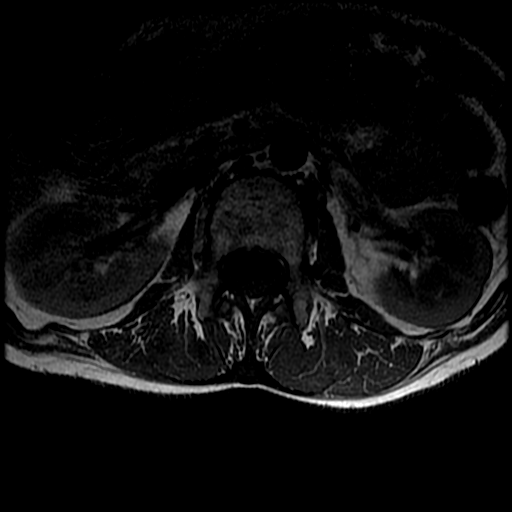

[19 of 48 positions shown; findings below may reference images not displayed]

FINDINGS: Segmentation:  Standard.

Alignment:  Physiologic.

Vertebrae:  No fracture, evidence of discitis, or bone lesion.

Conus medullaris and cauda equina: Conus extends to the L1 level.
Conus and cauda equina appear normal.

Paraspinal and other soft tissues: Normal visualized retroperitoneal
and paraspinal soft tissues.

Disc levels:

At L4-L5, there is a central disc protrusion that narrows the left
lateral recess. There is no central spinal canal or right lateral
recess stenosis. There is mild narrowing of the left neural foramen.

The other visualized disc spaces, from T11-T12 down, are normal.
IMPRESSION: 1. No spinal canal stenosis or finding to explain the reported
right-sided radiculopathy.
2. Small central/left subarticular L4-L5 disc protrusion narrowing
the left lateral recess, which could serve as a source of left L5
radiculopathy. There is also mild left foraminal stenosis at this
level, which could cause left L4 radiculopathy.

## 2019-01-30 IMAGING — CR DG CHEST 2V
2 series · 2 of 2 positions shown · non-contrast
Comparison: None.

CLINICAL DATA: Diagnosed with pneumonia in [REDACTED]. Continued
symptoms.

EXAM:
CHEST  2 VIEW

[w chest pa]
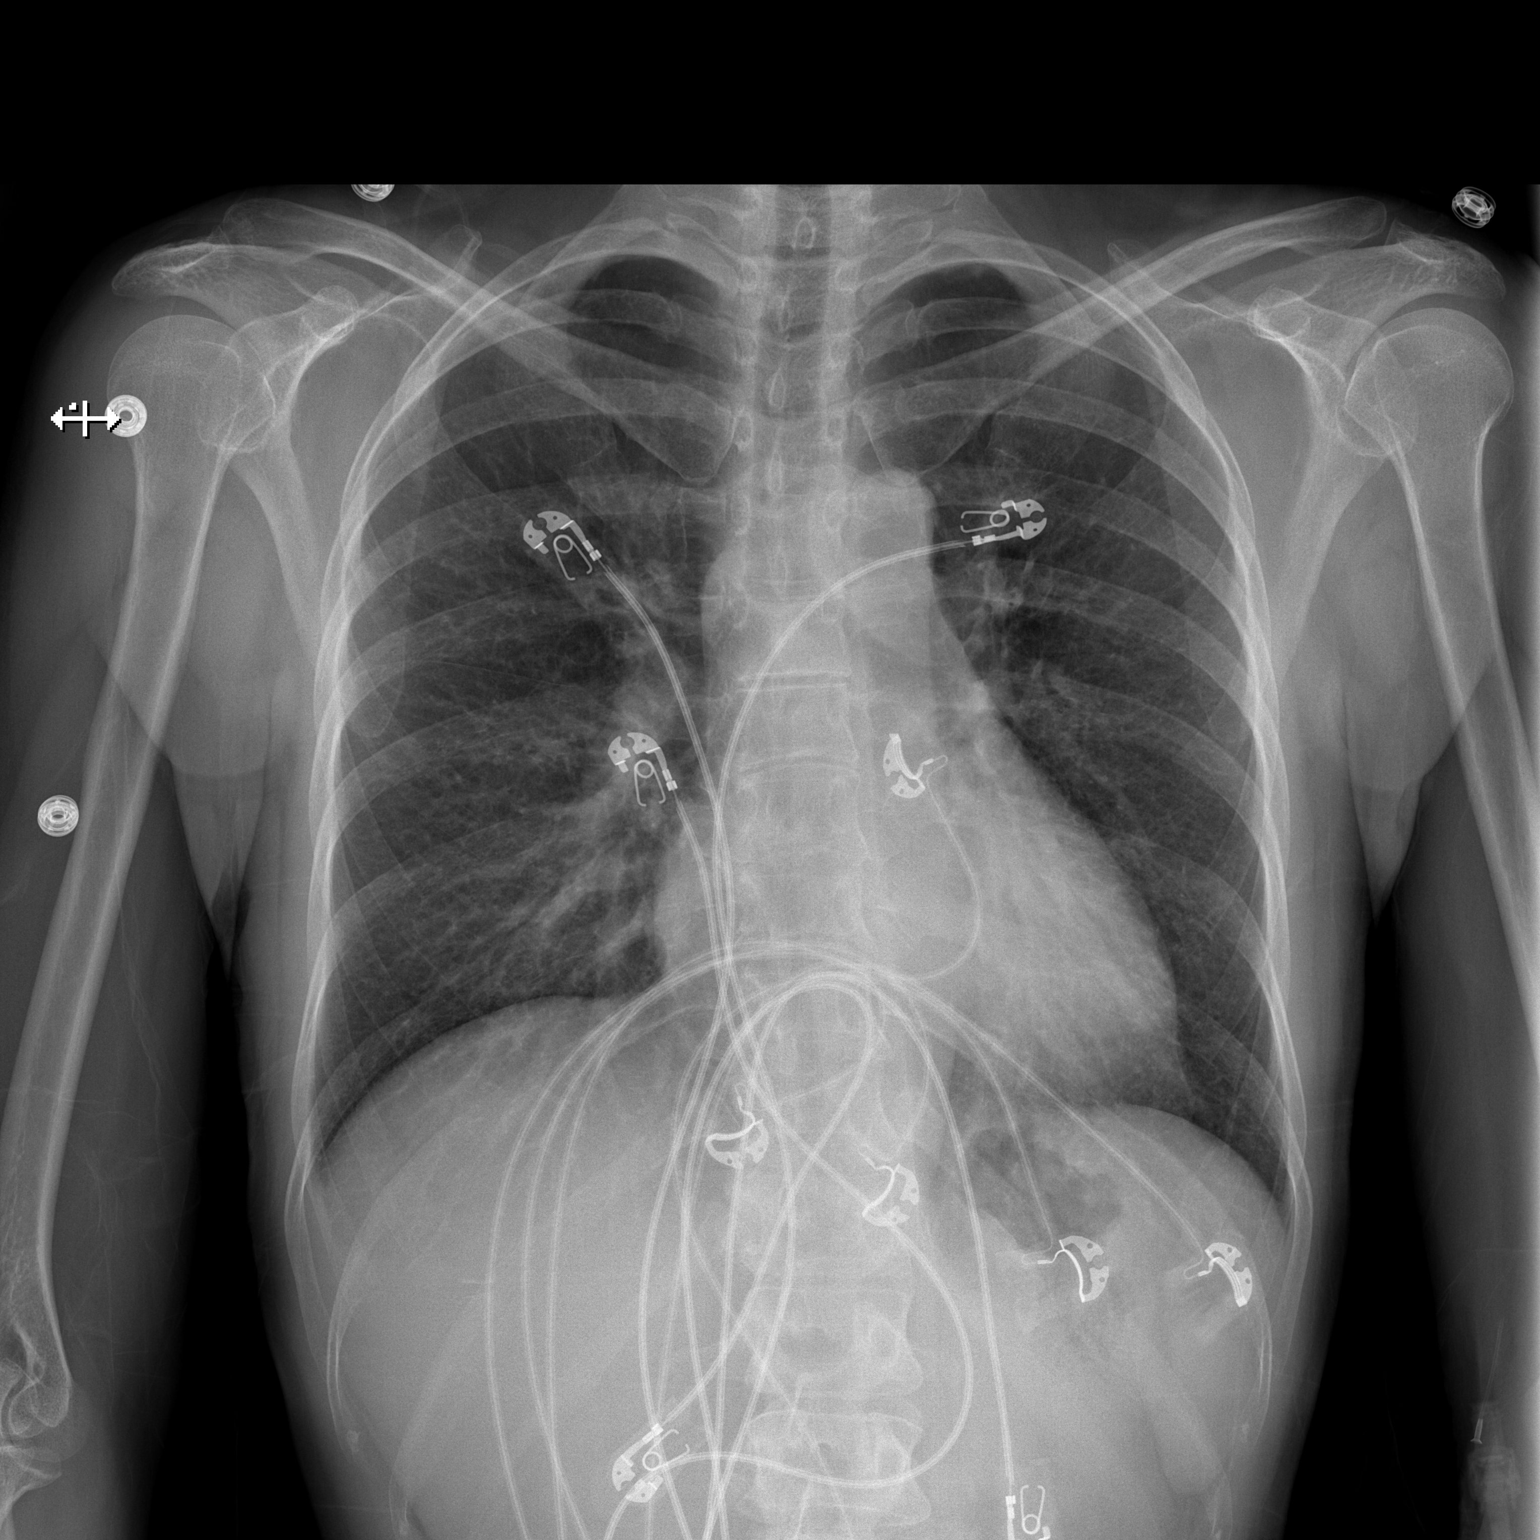

[w chest lat]
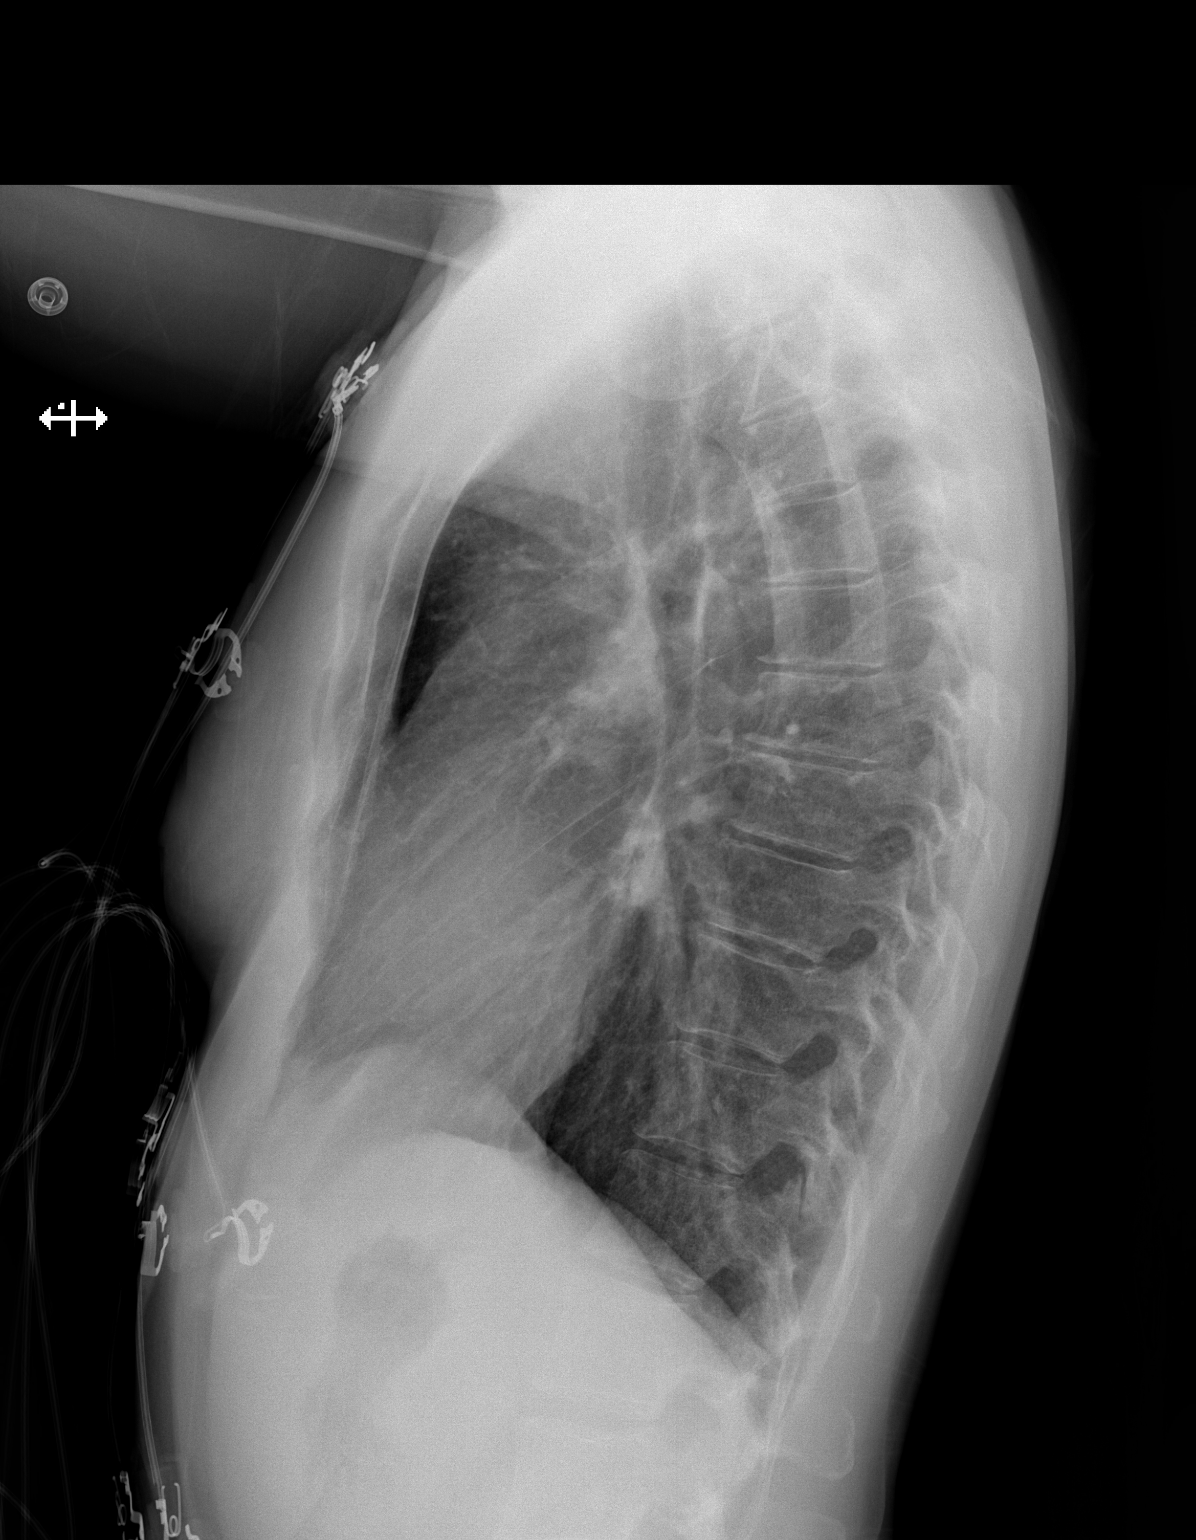

[2 of 2 positions shown; findings below may reference images not displayed]

FINDINGS: The heart, hila, and mediastinum are normal. No pneumothorax. No
nodules or masses. No focal infiltrates.
IMPRESSION: No active cardiopulmonary disease.

## 2023-12-05 ENCOUNTER — Emergency Department (HOSPITAL_BASED_OUTPATIENT_CLINIC_OR_DEPARTMENT_OTHER)
Admission: EM | Admit: 2023-12-05 | Discharge: 2023-12-05 | Disposition: A | Discharge status: Home / Self Care | Attending: Emergency Medicine | Admitting: Emergency Medicine

## 2023-12-05 ENCOUNTER — Emergency Department (HOSPITAL_BASED_OUTPATIENT_CLINIC_OR_DEPARTMENT_OTHER): Admitting: Radiology

## 2023-12-05 ENCOUNTER — Encounter (HOSPITAL_BASED_OUTPATIENT_CLINIC_OR_DEPARTMENT_OTHER): Payer: Self-pay | Admitting: Emergency Medicine

## 2023-12-05 ENCOUNTER — Other Ambulatory Visit: Payer: Self-pay

## 2023-12-05 DIAGNOSIS — E876 Hypokalemia: Secondary | ICD-10-CM | POA: Insufficient documentation

## 2023-12-05 DIAGNOSIS — R0602 Shortness of breath: Secondary | ICD-10-CM | POA: Insufficient documentation

## 2023-12-05 DIAGNOSIS — R203 Hyperesthesia: Secondary | ICD-10-CM

## 2023-12-05 DIAGNOSIS — R21 Rash and other nonspecific skin eruption: Secondary | ICD-10-CM | POA: Diagnosis present

## 2023-12-05 LAB — BASIC METABOLIC PANEL WITH GFR
Anion gap: 12 (ref 5–15)
BUN: 13 mg/dL (ref 6–20)
CO2: 26 mmol/L (ref 22–32)
Calcium: 9.7 mg/dL (ref 8.9–10.3)
Chloride: 103 mmol/L (ref 98–111)
Creatinine, Ser: 0.7 mg/dL (ref 0.44–1.00)
GFR, Estimated: >60 mL/min (ref >60)
Glucose, Bld: 84 mg/dL (ref 70–99)
Potassium: 3.4 mmol/L — ABNORMAL LOW (ref 3.5–5.1)
Sodium: 141 mmol/L (ref 135–145)

## 2023-12-05 LAB — CBC
HCT: 40 % (ref 36.0–46.0)
Hemoglobin: 13.1 g/dL (ref 12.0–15.0)
MCH: 28.2 pg (ref 26.0–34.0)
MCHC: 32.8 g/dL (ref 30.0–36.0)
MCV: 86.2 fL (ref 80.0–100.0)
Platelets: 294 10*3/uL (ref 150–400)
RBC: 4.64 MIL/uL (ref 3.87–5.11)
RDW: 12.7 % (ref 11.5–15.5)
WBC: 5.5 10*3/uL (ref 4.0–10.5)
nRBC: 0 % (ref 0.0–0.2)

## 2023-12-05 LAB — PREGNANCY, URINE: Preg Test, Ur: NEGATIVE

## 2023-12-05 LAB — TROPONIN T, HIGH SENSITIVITY: Troponin T High Sensitivity: <15 ng/L (ref <19)

## 2023-12-05 MED ORDER — OXYCODONE-ACETAMINOPHEN 5-325 MG PO TABS
1.0000 | ORAL_TABLET | Freq: Once | ORAL | Status: AC
  Administered 2023-12-05: 1 via ORAL
  Filled 2023-12-05: qty 1

## 2023-12-05 MED ORDER — OXYCODONE HCL 5 MG PO TABS
5.0000 mg | ORAL_TABLET | Freq: Four times a day (QID) | ORAL | 0 refills | Status: AC | PRN
Start: 2023-12-07 — End: ?

## 2023-12-05 NOTE — Discharge Instructions (Signed)
 As discussed, we will send in a different medicine for pain.  You may take this along with your gabapentin.  It is not significantly as tramadol so please do not take these medicines together.  Recommend follow-up with pain specialist in the outpatient setting for reassessment.  Please do not hesitate to return if the worrisome signs and symptoms we discussed become apparent.

## 2023-12-05 NOTE — ED Provider Notes (Signed)
 [facility] AT [facility] Provider Note   CSN: [id number] Arrival date & time: 12/05/23  1608     History  Chief Complaint  Patient presents with   Shortness of Breath    Suzanne Fisher is a 53 y.o. female.   Shortness of Breath   53 year old female presents emergency department with complaints of pain from shingles rash.  States that she was recently diagnosed with shingles on 11/16/2023.On acyclovir and given gabapentin for pain.  States that rash is since resolved but still having burning type sensation along distribution.  Rash is present right upper back, beneath axilla and above right breast.  Patient does have pictures with her that show maculopapular rash.  States that she was placed on tramadol by urgent care/primary care and did not help her symptoms very much.  States has been taking Tylenol  as well as ibuprofen in addition of this which has not been helping very much.  States that she has an appointment with the pain specialist on Monday for reassessment regarding postherpetic neuralgia.  Denies any exertional worsening of symptoms, shortness of breath, abdominal pain, nausea vomiting, fever.  Past medical history significant for MS, degenerative disc disease, lupus  Home Medications Prior to Admission medications   Medication Sig Start Date End Date Taking? Authorizing Provider  oxyCODONE (ROXICODONE) 5 MG immediate release tablet Take 1 tablet (5 mg total) by mouth every 6 (six) hours as needed for severe pain (pain score 7-10). 12/07/23  Yes Neil Balls A, PA  benzonatate  (TESSALON ) 100 MG capsule Take 1 capsule (100 mg total) by mouth every 8 (eight) hours. 08/25/17   Tran, Bowie, PA-C  buPROPion (WELLBUTRIN XL) 150 MG 24 hr tablet TAKE 1 TABLET BY MOUTH EVERY DAY IN THE MORNING 04/12/17   [provider]  cyclobenzaprine  (FLEXERIL ) 10 MG tablet Take 1 tablet (10 mg total) by mouth 3 (three) times daily as needed for muscle  spasms. 07/03/17   Dalene Duck, MD  diclofenac  (VOLTAREN ) 75 MG EC tablet TAKE 1 TABLET BY MOUTH TWICE A DAY FOR 14 DAYS 06/18/17   [provider]  diclofenac  sodium (VOLTAREN ) 1 % GEL Apply 2 g topically 4 (four) times daily. 07/03/17   Dalene Duck, MD  DULoxetine (CYMBALTA) 60 MG capsule Take by mouth. 04/12/17   [provider]  gabapentin (NEURONTIN) 300 MG capsule Take 300 mg by mouth 3 (three) times daily. 05/21/17   [provider]  omeprazole (PRILOSEC) 40 MG capsule TAKE 1 CAPSULE BY MOUTH EVERY DAY IN THE MORNING 30 MINUTES BEFORE BREAKFAST 04/08/17   [provider]  ondansetron  (ZOFRAN ) 4 MG tablet Take 1 tablet (4 mg total) by mouth every 8 (eight) hours as needed for nausea or vomiting. 08/25/17   Debbra Fairy, PA-C  traZODone (DESYREL) 100 MG tablet TAKE 1 TABLET BY MOUTH EVERY DAY AT NIGHT 04/12/17   [provider]      Allergies    Patient has no known allergies.    Review of Systems   Review of Systems  Respiratory:  Positive for shortness of breath.   All other systems reviewed and are negative.   Physical Exam Updated Vital Signs BP (!) 126/97 (BP Location: Left Arm)   Pulse (!) 107   Temp 98.4 F (36.9 C)   Resp 20   Ht 5\' 4"  (1.626 m)   Wt 51.3 kg   SpO2 97%   BMI 19.41 kg/m  Physical Exam Vitals and nursing note reviewed.  Constitutional:      General: She is not in acute distress.    Appearance: She is well-developed.  HENT:     Head: Normocephalic and atraumatic.  Eyes:     Conjunctiva/sclera: Conjunctivae normal.  Cardiovascular:     Rate and Rhythm: Normal rate and regular rhythm.     Heart sounds: No murmur heard. Pulmonary:     Effort: Pulmonary effort is normal. No respiratory distress.     Breath sounds: Normal breath sounds.  Abdominal:     Palpations: Abdomen is soft.     Tenderness: There is no abdominal tenderness.  Musculoskeletal:        General: No swelling.     Cervical  back: Neck supple.  Skin:    General: Skin is warm and dry.     Capillary Refill: Capillary refill takes less than 2 seconds.     Findings: No rash.     Comments: Patient with increased skin sensitivity T2/T3 region on the right side.  Very sensitive to light touch.  No obvious rash present currently.  Neurological:     Mental Status: She is alert.  Psychiatric:        Mood and Affect: Mood normal.     ED Results / Procedures / Treatments   Labs (all labs ordered are listed, but only abnormal results are displayed) Labs Reviewed  BASIC METABOLIC PANEL WITH GFR - Abnormal; Notable for the following components:      Result Value   Potassium 3.4 (*)    All other components within normal limits  CBC  PREGNANCY, URINE  TROPONIN T, HIGH SENSITIVITY    EKG None  Radiology DG Chest 2 View Result Date: 12/05/2023 CLINICAL DATA:  Shortness of breath. EXAM: CHEST - 2 VIEW COMPARISON:  08/25/2017. FINDINGS: Bilateral lung fields are clear. Bilateral costophrenic angles are clear. Normal cardio-mediastinal silhouette. No acute osseous abnormalities. Lower cervical spinal fixation hardware noted. The soft tissues are within normal limits. IMPRESSION: No active cardiopulmonary disease. Electronically Signed   By: Beula Brunswick M.D.   On: 12/05/2023 16:42    Procedures Procedures    Medications Ordered in ED Medications  oxyCODONE-acetaminophen  (PERCOCET/ROXICET) 5-325 MG per tablet 1 tablet (1 tablet Oral Given 12/05/23 1758)    ED Course/ Medical Decision Making/ A&P                                 Medical Decision Making Amount and/or Complexity of Data Reviewed Labs: ordered. Radiology: ordered.  Risk Prescription drug management.   This patient presents to the ED for concern of pain, this involves an extensive number of treatment options, and is a complaint that carries with it a high risk of complications and morbidity.  The differential diagnosis includes ACS, PE,  musculoskeletal pain, pneumothorax, postherpetic neuralgia, other   Co morbidities that complicate the patient evaluation  See HPI   Additional history obtained:  Additional history obtained from EMR External records from outside source obtained and reviewed including hospital records   Lab Tests:  I Ordered, and personally interpreted labs.  The pertinent results include: Mild hypokalemia 3.4 otherwise, S within normal limits.  No renal dysfunction.  No leukocytosis.  No evidence of anemia.  Placed within normal range.  Urine pregnancy negative.  Troponin less than 15..   Imaging Studies ordered:  I ordered imaging studies including chest x-ray I independently visualized and interpreted imaging which showed no acute  cardiac abnormality I agree with the radiologist interpretation   Cardiac Monitoring: / EKG:  The patient was maintained on a cardiac monitor.  I personally viewed and interpreted the cardiac monitored which showed an underlying rhythm of: Normal sinus rhythm   Consultations Obtained:  N/a   Problem List / ED Course / Critical interventions / Medication management  Hyperesthesia I ordered medication including Percocet   Reevaluation of the patient after these medicines showed that the patient improved I have reviewed the patients home medicines and have made adjustments as needed   Social Determinants of Health:  Denies tobacco,'s or drug use.   Test / Admission - Considered:  Hyperesthesia Vitals signs within normal range and stable throughout visit. Laboratory/imaging studies significant for: see above 53 year old female presents emergency department with complaints of pain from shingles rash.  States that she was recently diagnosed with shingles on 11/16/2023.On acyclovir and given gabapentin for pain.  States that rash is since resolved but still having burning type sensation along distribution.  Rash is present right upper back, beneath axilla  and above right breast.  Patient does have pictures with her that show maculopapular rash.  States that she was placed on tramadol by urgent care/primary care and did not help her symptoms very much.  States has been taking Tylenol  as well as ibuprofen in addition of this which has not been helping very much.  States that she has an appointment with the pain specialist on Monday for reassessment regarding postherpetic neuralgia.  Denies any exertional worsening of symptoms, shortness of breath, abdominal pain, nausea vomiting, fever. On exam, pain distributed over T2/T3 right side without obvious rash.  Skin increasingly sensitive to the touch.  Workup today performed by triage staff reassuring.  Patient with negative troponin, nonspecific changes on EKG; symptoms do not seem consistent with ACS.  Chest x-ray performed which did not show evidence of pneumonia, pneumothorax or other acute cardiopulmonary abnormality.  Suspect the patient is having postherpetic neuralgia.  Pictures on patient's phone dated 2 timeline that is in line with PCP note on record does show evidence of rash consistent with herpes zoster in same distribution described by patient.  Will trial a different medication to treat patient's pain and lieu of follow-up with pain management on Monday.  Treatment plan discussed with patient and she concerns and was agreeable to said plan.  Patient overall well-appearing, afebrile in no acute distress. Worrisome signs and symptoms were discussed with the patient, and the patient acknowledged understanding to return to the ED if noticed. Patient was stable upon discharge.          Final Clinical Impression(s) / ED Diagnoses Final diagnoses:  Hyperesthesia    Rx / DC Orders      Camp Dennison Butter, PA 12/05/23 1833    Sallyanne Creamer, DO 12/06/23 1350

## 2023-12-05 NOTE — ED Triage Notes (Addendum)
 Pt via pov from home with sob that happens when she has a sharp pain in her back that travels to the front. Her pcp has set her up with a pain clinic next week. She has hx of lupus. Pt alert & oriented, nad noted.   Pt recently had shingles; states the rash got better but that the pain is increasing.

## 2023-12-18 NOTE — Therapy (Signed)
 OUTPATIENT PHYSICAL THERAPY CERVICAL EVALUATION   Patient Name: Suzanne Fisher MRN: [id number] DOB:1971/07/16, 53 y.o., female Today's Date: 12/19/2023  END OF SESSION:  PT End of Session - 12/19/23 1612     Visit Number 1    Number of Visits 7    Date for PT Re-Evaluation 01/30/24    Authorization Type MCD    PT Start Time 1615    PT Stop Time 1651    PT Time Calculation (min) 36 min    Activity Tolerance No increased pain;Patient limited by pain             Past Medical History:  Diagnosis Date   H/O degenerative disc disease    Lupus    MS (multiple sclerosis) (HCC)    Past Surgical History:  Procedure Laterality Date   CERVICAL FUSION     CESAREAN SECTION     TEMPOROMANDIBULAR JOINT SURGERY     There are no active problems to display for this patient.   PCP: No PCP in chart  REFERRING PROVIDER: Joaquin Mulberry, MD  REFERRING DIAG: [phone] (ICD-10-CM) - Radiculopathy of cervical region  THERAPY DIAG:  Cervicalgia  Abnormal posture  Rationale for Evaluation and Treatment: Rehabilitation  ONSET DATE: chronic, worsening over past year  SUBJECTIVE:                                                                                                                                                                                                         SUBJECTIVE STATEMENT: Pt endorses longstanding history of neck/UE pain, prior cervical fusion 2015. States she had PT before and after, has also had bout of PT around 2020 for similar symptoms. States this episode is the same kind of symptoms but with significant increase in intensity over past year, particularly last six months. She states she was told she needed PT before she can get an MRI, states PT has not been very helpful in past. She describes shooting pains and numbness in either UE, typically R>L although today is more L than R. Has been doing exercises from prior bouts of PT. She states she has a nerve  conduction study test coming up.  Hand dominance: Right  PERTINENT HISTORY:   lupus, MS, hx cervical fusion 2015, hx migraines since high school  PAIN:  Are you having pain: 10+/10 Location/description: BIL neck pain Best-worst over past week: 7-10/10  - aggravating factors: upper body dressing, raising arm, carrying objects  - Easing factors: none (has tried ice, heat, pain patches, tens)     PRECAUTIONS: hx  fusion  RED FLAGS: No diplopia, no swallowing difficulties, no changes in speech, denies recent changes in migraines. Gait WNL. She does endorse that at times pain will get so bad she feels nauseous.     WEIGHT BEARING RESTRICTIONS: No  FALLS:  Has patient fallen in last 6 months? No  LIVING ENVIRONMENT: Lives w/ 53 y.o, 2 story home. 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  DIAGNOSTIC FINDINGS:  No recent imaging in chart - pt states she has had recent chest XR that showed "impingement" of her spine. States orthopedist told her she may need a disc replacement and referred to neurosurgery  PATIENT SURVEYS:  NDI: 41/50; 82%    COGNITION: Overall cognitive status: Within functional limits for tasks assessed  SENSATION: Reduced sensation L C4, C5, C7 dermatomes compared to R; intact otherwise Negative hoffman/tromner sign BIL ; pt does remark manipulation of L hand sparks posterior L shoulder pain  POSTURE: very guarded UT posture, L>R  PALPATION: Deferred given symptom irritability   CERVICAL ROM:   ROM A/PROM (deg) eval  Flexion   Extension   Right lateral flexion   Left lateral flexion   Right rotation 28 deg   Left rotation 30 deg *   (Blank rows = not  tested) (Key: WFL = within functional limits not formally assessed, * = concordant pain, s = stiffness/stretching sensation, NT = not tested) Comment:   UPPER EXTREMITY ROM:  A/PROM Right eval Left eval  Shoulder flexion At least 90 deg observation 44 deg *  Shoulder abduction    Shoulder internal rotation    Shoulder external rotation    Elbow flexion    Elbow extension    Wrist flexion    Wrist extension     (Blank rows = not tested) (Key: WFL = within functional limits not formally assessed, * = concordant pain, s = stiffness/stretching sensation, NT = not tested)  Comments:    UPPER EXTREMITY MMT:  MMT Right eval Left eval  Shoulder flexion    Shoulder extension    Shoulder abduction    Shoulder extension    Shoulder internal rotation    Shoulder external rotation    Elbow flexion    Elbow extension    Grip strength    (Blank rows = not tested)  (Key: WFL = within functional limits not formally assessed, * = concordant pain, s = stiffness/stretching sensation, NT = not tested)  Comments: deferred given symptom irritability   CERVICAL SPECIAL TESTS:  Unable to test given symptom irritability   FUNCTIONAL TESTS:  Overhead reaching limited as above  TREATMENT DATE:  [facility] Adult PT Treatment:                                                DATE: 12/19/23 Limited given high symptom irritability  PATIENT EDUCATION:  Education details: Pt education on PT impairments, prognosis, and POC. Informed consent. Rationale for interventions Person educated: Patient Education method: Explanation, Demonstration, Tactile cues, Verbal cues Education comprehension: verbalized understanding, returned demonstration, verbal cues required, tactile cues required, and needs further education    HOME EXERCISE PROGRAM: Deferred given high symptom  irritability  ASSESSMENT:  CLINICAL IMPRESSION: Patient is a 54 y.o. woman who was seen today for physical therapy evaluation and treatment for neck pain/UE pain. Pt endorses significant limitations in daily/work activities involving cervical/UE movements. Red flag questioning is overall reassuring although symptoms are highly irritable which limits exam today. She does demonstrate significant limitations in cervical and GH mobility, impaired sensory exam. HEP is deferred given high symptom irritability with limited exam, no adverse events, pt denies increase in symptoms on departure. Recommend trial of skilled PT to address aforementioned deficits with aim of improving functional tolerance and reducing pain with typical activities. Pt departs today's session in no acute distress, all voiced concerns/questions addressed appropriately from PT perspective.    OBJECTIVE IMPAIRMENTS: decreased activity tolerance, decreased endurance, decreased mobility, decreased ROM, decreased strength, impaired perceived functional ability, impaired flexibility, impaired sensation, impaired UE functional use, postural dysfunction, and pain.   ACTIVITY LIMITATIONS: carrying, lifting, sitting, bathing, dressing, reach over head, and hygiene/grooming  PARTICIPATION LIMITATIONS: meal prep, cleaning, laundry, driving, shopping, community activity, and occupation  PERSONAL FACTORS: Time since onset of injury/illness/exacerbation and 3+ comorbidities: lupus, MS, hx cervical fusion are also affecting patient's functional outcome.   REHAB POTENTIAL: Guarded  CLINICAL DECISION MAKING: Unstable/unpredictable  EVALUATION COMPLEXITY: High   GOALS:  SHORT TERM GOALS: Target date: 01/09/2024  Pt will demonstrate appropriate understanding and performance of initially prescribed HEP in order to facilitate improved independence with management of symptoms.  Baseline: HEP TBD  Goal status: INITIAL   2. Pt will report at  least 25% improvement in overall pain levels over past week in order to facilitate improved tolerance to typical daily activities.   Baseline: 7-10+/10  Goal status: INITIAL    LONG TERM GOALS: Target date: 01/30/2024  Pt will score less than or equal to 30/50 on NDI in order to demonstrate improved perception of function due to symptoms (MDC 10-13 pts per Adline Hook al 2009, 2010). Baseline: 41/50 Goal status: INITIAL  2. Pt will demonstrate at least 40 degrees of active cervical rotation ROM in order to demonstrate improved environmental awareness and safety with driving.  Baseline: see ROM chart above Goal status: INITIAL  3. Pt will demonstrate at least 4+/5 shoulder MMT for improved symmetry of UE strength and improved tolerance to functional movements.  Baseline: see MMT chart above Goal status: INITIAL   4. Pt will report ability to perform upper body dressing with less than 2 point increase on NPS in order to demonstrate improved tolerance to ADLs. Baseline: 10+/10 Goal status: INITIAL   5. Pt will demonstrate at least 90 deg of GH AROM BIL in order to facilitate improved functional reaching.  Baseline: see ROM chart above  Goal status: INITIAL   PLAN:  PT FREQUENCY: 1-2x/week  PT DURATION: 6 weeks  PLANNED INTERVENTIONS: 97164- PT Re-evaluation, 97750- Physical Performance Testing, 97110-Therapeutic exercises, 97530- Therapeutic activity, [id number]- Neuromuscular re-education, 97535- Self Care, 09811- Manual therapy, Patient/Family education, Taping, Dry Needling, Joint mobilization, Spinal mobilization, Cryotherapy, and Moist heat  PLAN FOR NEXT SESSION: establish HEP. Emphasis on comfortable muscle contractions, pain modulation, tolerable ROM. Symptom modification strategies as indicated/appropriate.    Lovett Ruck PT,  DPT 12/19/2023 5:18 PM    For all possible CPT codes, reference the Planned Interventions line above.     Check all conditions that are expected to  impact treatment: {Conditions expected to impact treatment:Musculoskeletal disorders

## 2023-12-19 ENCOUNTER — Encounter: Payer: Self-pay | Admitting: Physical Therapy

## 2023-12-19 ENCOUNTER — Other Ambulatory Visit: Payer: Self-pay

## 2023-12-19 ENCOUNTER — Ambulatory Visit: Attending: Neurological Surgery | Admitting: Physical Therapy

## 2023-12-19 DIAGNOSIS — M542 Cervicalgia: Secondary | ICD-10-CM | POA: Diagnosis present

## 2023-12-19 DIAGNOSIS — R293 Abnormal posture: Secondary | ICD-10-CM | POA: Insufficient documentation

## 2024-01-09 ENCOUNTER — Ambulatory Visit: Payer: Self-pay | Attending: Neurological Surgery

## 2024-01-09 DIAGNOSIS — R293 Abnormal posture: Secondary | ICD-10-CM | POA: Diagnosis present

## 2024-01-09 DIAGNOSIS — M542 Cervicalgia: Secondary | ICD-10-CM | POA: Insufficient documentation

## 2024-01-09 NOTE — Therapy (Signed)
 OUTPATIENT PHYSICAL THERAPY NOTE   Patient Name: Suzanne Fisher MRN: [id number] DOB:05-27-1971, 53 y.o., female Today's Date: 01/09/2024  END OF SESSION:  PT End of Session - 01/09/24 0835     Visit Number 2    Number of Visits 7    Date for PT Re-Evaluation 01/30/24    Authorization Type MCD    PT Start Time 0832    PT Stop Time 0912    PT Time Calculation (min) 40 min    Activity Tolerance No increased pain;Patient limited by pain    Behavior During Therapy [facility] for tasks assessed/performed           Past Medical History:  Diagnosis Date   H/O degenerative disc disease    Lupus    MS (multiple sclerosis) (HCC)    Past Surgical History:  Procedure Laterality Date   CERVICAL FUSION     CESAREAN SECTION     TEMPOROMANDIBULAR JOINT SURGERY     There are no active problems to display for this patient.   PCP: No PCP in chart  REFERRING PROVIDER: Joaquin Mulberry, MD  REFERRING DIAG: [phone] (ICD-10-CM) - Radiculopathy of cervical region  THERAPY DIAG:  Cervicalgia  Abnormal posture  Rationale for Evaluation and Treatment: Rehabilitation  ONSET DATE: chronic, worsening over past year  SUBJECTIVE:                                                                                                                                                                                                         SUBJECTIVE STATEMENT: 01/09/2024 Patient reports to PT without change in severity of symptoms.    Pt endorses longstanding history of neck/UE pain, prior cervical fusion 2015. States she had PT before and after, has also had bout of PT around 2020 for similar symptoms. States this episode is the same kind of symptoms but with significant increase in intensity over past year, particularly last six months. She states she was told she needed PT before she can get an MRI, states PT has not been very helpful in past. She describes shooting pains and numbness in either UE,  typically R>L although today is more L than R. Has been doing exercises from prior bouts of PT. She states she has a nerve conduction study test coming up.   Hand dominance: Right  PERTINENT HISTORY:   lupus, MS, hx cervical fusion 2015, hx migraines since high school  PAIN:  Are you having pain: 10+/10 Location/description: BIL neck pain Best-worst over past week: 7-10/10  - aggravating factors: upper body dressing,  raising arm, carrying objects  - Easing factors: none (has tried ice, heat, pain patches, tens)     PRECAUTIONS: hx fusion  RED FLAGS: No diplopia, no swallowing difficulties, no changes in speech, denies recent changes in migraines. Gait WNL. She does endorse that at times pain will get so bad she feels nauseous.     WEIGHT BEARING RESTRICTIONS: No  FALLS:  Has patient fallen in last 6 months? No  LIVING ENVIRONMENT: Lives w/ 53 y.o, 2 story home. 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  DIAGNOSTIC FINDINGS:  No recent imaging in chart - pt states she has had recent chest XR that showed impingement of her spine. States orthopedist told her she may need a disc replacement and referred to neurosurgery  PATIENT SURVEYS:  NDI: 41/50; 82%    COGNITION: Overall cognitive status: Within functional limits for tasks assessed  SENSATION: Reduced sensation L C4, C5, C7 dermatomes compared to R; intact otherwise Negative hoffman/tromner sign BIL ; pt does remark manipulation of L hand sparks posterior L shoulder pain  POSTURE: very guarded UT posture, L>R  PALPATION: Deferred given symptom irritability   CERVICAL ROM:   ROM A/PROM (deg) eval  Flexion   Extension 5  deg, p! In B UE  Right lateral flexion   Left lateral flexion   Right rotation 28 deg   Left rotation 30 deg *   (Blank rows = not tested) (Key: WFL = within functional limits not formally assessed, * = concordant pain, s = stiffness/stretching sensation, NT = not tested) Comment:   UPPER EXTREMITY ROM:  A/PROM Right eval Left eval  Shoulder flexion At least 90 deg observation 44 deg *  Shoulder abduction    Shoulder internal rotation    Shoulder external rotation    Elbow flexion    Elbow extension    Wrist flexion    Wrist extension     (Blank rows = not tested) (Key: WFL = within functional limits not formally assessed, * = concordant pain, s = stiffness/stretching sensation, NT = not tested)  Comments:    UPPER EXTREMITY MMT:  MMT Right eval Left eval  Shoulder flexion    Shoulder extension    Shoulder abduction    Shoulder extension    Shoulder internal rotation    Shoulder external rotation    Elbow flexion    Elbow extension    Grip strength    (Blank rows = not tested)  (Key: WFL = within functional limits not formally assessed, * = concordant pain, s = stiffness/stretching sensation, NT = not tested)  Comments: deferred given symptom irritability   CERVICAL SPECIAL TESTS:  Unable to test given symptom irritability   FUNCTIONAL TESTS:  Overhead reaching limited as above  TREATMENT DATE:    [facility] Adult PT Treatment:                                                DATE: 01/09/2024  Therapeutic Exercise: PROM cervical rotation, sidebending, shoulder depressing  Cervical retraction, x 5 (best tolerated with cervical extension)  Cervical  side bending 2 x 10 sec each side (request discontinuing d/t TS and LS stretching)  Seated scapular retraction  Seated resisted shoulder ER bilater with red TB, poor tolerance  Seated resisted shoulder horizontal abduction with red TB, small arc   [facility] Adult PT Treatment:                                                 DATE: 12/19/23 Limited given high symptom irritability                                                                                                                              PATIENT EDUCATION:  Education details: Pt education on PT impairments, prognosis, and POC. Informed consent. Rationale for interventions Person educated: Patient Education method: Explanation, Demonstration, Tactile cues, Verbal cues Education comprehension: verbalized understanding, returned demonstration, verbal cues required, tactile cues required, and needs further education    HOME EXERCISE PROGRAM: Deferred given high symptom irritability   ASSESSMENT:  CLINICAL IMPRESSION:  01/09/2024: Patient continues to have limited tolerance of therapeutic exercises d/t CS, Thoracic and lumbar pain. However, she was able to demonstrate 5 degree improvement in cervical extension at end of session, with decreased radicular symptoms. We will continue to gradually progress per current POC as tolerated.    Eval: Patient is a 53 y.o. woman who was seen today for physical therapy evaluation and treatment for neck pain/UE pain. Pt endorses significant limitations in daily/work activities involving cervical/UE movements. Red flag questioning is overall reassuring although symptoms are highly irritable which limits exam today. She does demonstrate significant limitations in cervical and GH mobility, impaired sensory exam. HEP is deferred given high symptom irritability with limited exam, no adverse events, pt denies increase in symptoms on departure. Recommend trial of skilled PT to address aforementioned deficits with aim of improving functional tolerance and reducing pain with typical activities. Pt departs today's session in no acute distress, all voiced concerns/questions addressed appropriately from PT perspective.    OBJECTIVE IMPAIRMENTS: decreased activity tolerance, decreased endurance, decreased mobility, decreased ROM,  decreased strength, impaired perceived functional ability, impaired flexibility, impaired sensation, impaired UE functional use, postural dysfunction, and pain.   ACTIVITY LIMITATIONS: carrying, lifting, sitting, bathing, dressing, reach over head, and hygiene/grooming  PARTICIPATION LIMITATIONS: meal prep, cleaning, laundry, driving, shopping, community activity, and occupation  PERSONAL FACTORS: Time since onset of injury/illness/exacerbation and 3+ comorbidities: lupus, MS, hx cervical fusion are also affecting patient's functional outcome.   REHAB POTENTIAL: Guarded  CLINICAL DECISION MAKING: Unstable/unpredictable  EVALUATION COMPLEXITY: High   GOALS:  SHORT TERM GOALS: Target date: 01/09/2024  Pt will demonstrate appropriate understanding and performance of initially prescribed HEP in order to facilitate improved independence with management of symptoms.  Baseline: HEP TBD  Goal status: INITIAL   2. Pt will report at least  25% improvement in overall pain levels over past week in order to facilitate improved tolerance to typical daily activities.   Baseline: 7-10+/10  Goal status: INITIAL    LONG TERM GOALS: Target date: 01/30/2024  Pt will score less than or equal to 30/50 on NDI in order to demonstrate improved perception of function due to symptoms (MDC 10-13 pts per Adline Hook al 2009, 2010). Baseline: 41/50 Goal status: INITIAL  2. Pt will demonstrate at least 40 degrees of active cervical rotation ROM in order to demonstrate improved environmental awareness and safety with driving.  Baseline: see ROM chart above Goal status: INITIAL  3. Pt will demonstrate at least 4+/5 shoulder MMT for improved symmetry of UE strength and improved tolerance to functional movements.  Baseline: see MMT chart above Goal status: INITIAL   4. Pt will report ability to perform upper body dressing with less than 2 point increase on NPS in order to demonstrate improved tolerance to  ADLs. Baseline: 10+/10 Goal status: INITIAL   5. Pt will demonstrate at least 90 deg of GH AROM BIL in order to facilitate improved functional reaching.  Baseline: see ROM chart above  Goal status: INITIAL   PLAN:  PT FREQUENCY: 1-2x/week  PT DURATION: 6 weeks  PLANNED INTERVENTIONS: 97164- PT Re-evaluation, 97750- Physical Performance Testing, 97110-Therapeutic exercises, 97530- Therapeutic activity, [id number]- Neuromuscular re-education, 97535- Self Care, 16109- Manual therapy, Patient/Family education, Taping, Dry Needling, Joint mobilization, Spinal mobilization, Cryotherapy, and Moist heat  PLAN FOR NEXT SESSION: establish HEP. Emphasis on comfortable muscle contractions, pain modulation, tolerable ROM. Symptom modification strategies as indicated/appropriate.    Arlester Bence, PT, DPT  01/09/2024 10:03 AM    For all possible CPT codes, reference the Planned Interventions line above.     Check all conditions that are expected to impact treatment: {Conditions expected to impact treatment:Musculoskeletal disorders

## 2024-01-14 ENCOUNTER — Ambulatory Visit

## 2024-01-22 ENCOUNTER — Ambulatory Visit

## 2024-01-29 ENCOUNTER — Ambulatory Visit

## 2024-02-05 ENCOUNTER — Ambulatory Visit: Attending: Neurological Surgery

## 2024-02-05 DIAGNOSIS — M542 Cervicalgia: Secondary | ICD-10-CM | POA: Diagnosis present

## 2024-02-05 DIAGNOSIS — R293 Abnormal posture: Secondary | ICD-10-CM | POA: Diagnosis present

## 2024-02-05 NOTE — Addendum Note (Signed)
 Addended by: JOSHUA GUN on: 02/05/2024 01:57 PM   Modules accepted: Orders

## 2024-02-05 NOTE — Therapy (Addendum)
 OUTPATIENT PHYSICAL THERAPY NOTE   Patient Name: Suzanne Fisher MRN: [id number] DOB:18-Jan-1971, 53 y.o., female Today's Date: 02/05/2024  END OF SESSION:  PT End of Session - 02/05/24 0916     Visit Number 3    Number of Visits 7    Date for PT Re-Evaluation 03/04/24    Authorization Type MCD    PT Start Time 0917    PT Stop Time 0955    PT Time Calculation (min) 38 min    Activity Tolerance No increased pain;Patient limited by pain    Behavior During Therapy [facility] for tasks assessed/performed            Past Medical History:  Diagnosis Date   H/O degenerative disc disease    Lupus    MS (multiple sclerosis) (HCC)    Past Surgical History:  Procedure Laterality Date   CERVICAL FUSION     CESAREAN SECTION     TEMPOROMANDIBULAR JOINT SURGERY     There are no active problems to display for this patient.   PCP: No PCP in chart  REFERRING PROVIDER: Joshua Alm Hamilton, MD  REFERRING DIAG: [phone] (ICD-10-CM) - Radiculopathy of cervical region  THERAPY DIAG:  Cervicalgia  Abnormal posture  Rationale for Evaluation and Treatment: Rehabilitation  ONSET DATE: chronic, worsening over past year  SUBJECTIVE:                                                                                                                                                                                                         SUBJECTIVE STATEMENT: 02/05/2024 Patient reports that her L shoulder has been worse lately. She has an appointment to see an orthopedic on 02/18/2024 regarding her shoulder. She states that her pain is still 10/10 neck and shoulder, which it always is.   Pt endorses longstanding history of neck/UE pain, prior cervical fusion 2015. States she had PT before and after, has also had bout of PT around 2020 for similar symptoms. States this episode is the same kind of symptoms but with significant increase in intensity over past year, particularly last six months. She states  she was told she needed PT before she can get an MRI, states PT has not been very helpful in past. She describes shooting pains and numbness in either UE, typically R>L although today is more L than R. Has been doing exercises from prior bouts of PT. She states she has a nerve conduction study test coming up.   Hand dominance: Right  PERTINENT HISTORY:   lupus, MS, hx cervical fusion 2015, hx  migraines since high school  PAIN:  Are you having pain: 10+/10 Location/description: BIL neck pain Best-worst over past week: 7-10/10  - aggravating factors: upper body dressing, raising arm, carrying objects  - Easing factors: none (has tried ice, heat, pain patches, tens)     PRECAUTIONS: hx fusion  RED FLAGS: No diplopia, no swallowing difficulties, no changes in speech, denies recent changes in migraines. Gait WNL. She does endorse that at times pain will get so bad she feels nauseous.     WEIGHT BEARING RESTRICTIONS: No  FALLS:  Has patient fallen in last 6 months? No  LIVING ENVIRONMENT: Lives w/ 53 y.o, 2 story home. 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  DIAGNOSTIC FINDINGS:  No recent imaging in chart - pt states she has had recent chest XR that showed impingement of her spine. States orthopedist told her she may need a disc replacement and referred to neurosurgery  PATIENT SURVEYS:  NDI: 41/50; 82%    02/05/24: 42/50: 84%  COGNITION: Overall cognitive status: Within functional limits for tasks assessed  SENSATION: Reduced sensation L C4, C5, C7 dermatomes compared to R; intact otherwise Negative hoffman/tromner sign BIL ; pt does remark manipulation of L hand sparks posterior L  shoulder pain  POSTURE: very guarded UT posture, L>R  PALPATION: Deferred given symptom irritability   CERVICAL ROM:   ROM A/PROM (deg) eval AROM  Flexion    Extension 5 deg, p! In B UE   Right lateral flexion    Left lateral flexion    Right rotation 28 deg  28 deg  Left rotation 30 deg * 25 deg   (Blank rows = not tested) (Key: WFL = within functional limits not formally assessed, * = concordant pain, s = stiffness/stretching sensation, NT = not tested) Comment:   UPPER EXTREMITY ROM:  A/PROM Right eval Left eval Right 02/05/2024 Left 02/05/2024  Shoulder flexion At least 90 deg observation 44 deg * 90 deg 60 deg  Shoulder abduction      Shoulder internal rotation      Shoulder external rotation      Elbow flexion      Elbow extension      Wrist flexion      Wrist extension       (Blank rows = not tested) (Key: WFL = within functional limits not formally assessed, * = concordant pain, s = stiffness/stretching sensation, NT = not tested)  Comments:    UPPER EXTREMITY MMT:  MMT Right eval Left eval  Shoulder flexion    Shoulder extension    Shoulder abduction    Shoulder extension    Shoulder internal rotation    Shoulder external rotation    Elbow flexion    Elbow extension    Grip strength    (Blank rows = not tested)  (Key: WFL = within functional limits not formally assessed, * = concordant pain, s = stiffness/stretching sensation, NT = not tested)  Comments: deferred given symptom irritability   CERVICAL SPECIAL TESTS:  Unable to test given symptom irritability   FUNCTIONAL TESTS:  Overhead reaching limited as above  TREATMENT DATE:    [facility] Adult PT Treatment:  DATE: 02/05/2024   Reassessment of objective measures and subjective assessment regarding progress towards established goals and updated plan for addressing remaining deficits and rehab goals.     [facility] Adult PT Treatment:                                                 DATE: 01/09/2024  Therapeutic Exercise: PROM cervical rotation, sidebending, shoulder depressing  Cervical retraction, x 5 (best tolerated with cervical extension)  Cervical side bending 2 x 10 sec each side (request discontinuing d/t TS and LS stretching)  Seated scapular retraction  Seated resisted shoulder ER bilater with red TB, poor tolerance  Seated resisted shoulder horizontal abduction with red TB, small arc   [facility] Adult PT Treatment:                                                DATE: 12/19/23 Limited given high symptom irritability                                                                                                                              PATIENT EDUCATION:  Education details: Pt education on PT impairments, prognosis, and POC. Informed consent. Rationale for interventions Person educated: Patient Education method: Explanation, Demonstration, Tactile cues, Verbal cues Education comprehension: verbalized understanding, returned demonstration, verbal cues required, tactile cues required, and needs further education    HOME EXERCISE PROGRAM: Provided at first f/u    ASSESSMENT:  CLINICAL IMPRESSION:  02/05/2024: Patient has attended 2 PT sessions to address Neck Pain with BIL UE pain. She had difficulty attending all scheduled appointments d/t onset of GI issues. She also had recent worsening of shoulder pain. Discussed plan for extended POC to further address pain and related deficits. She requires ongoing skilled PT intervention in order to address remaining deficits and progress towards functional rehab goals.    Eval: Patient is a 53 y.o. woman who was seen today for physical therapy evaluation and treatment for neck pain/UE pain. Pt endorses significant limitations in daily/work activities involving cervical/UE movements. Red flag questioning is overall reassuring although symptoms are highly irritable which limits exam today. She  does demonstrate significant limitations in cervical and GH mobility, impaired sensory exam. HEP is deferred given high symptom irritability with limited exam, no adverse events, pt denies increase in symptoms on departure. Recommend trial of skilled PT to address aforementioned deficits with aim of improving functional tolerance and reducing pain with typical activities. Pt departs today's session in no acute distress, all voiced concerns/questions addressed appropriately from PT perspective.    OBJECTIVE IMPAIRMENTS: decreased activity tolerance, decreased endurance, decreased mobility, decreased ROM, decreased strength, impaired perceived functional ability, impaired  flexibility, impaired sensation, impaired UE functional use, postural dysfunction, and pain.   ACTIVITY LIMITATIONS: carrying, lifting, sitting, bathing, dressing, reach over head, and hygiene/grooming  PARTICIPATION LIMITATIONS: meal prep, cleaning, laundry, driving, shopping, community activity, and occupation  PERSONAL FACTORS: Time since onset of injury/illness/exacerbation and 3+ comorbidities: lupus, MS, hx cervical fusion are also affecting patient's functional outcome.   REHAB POTENTIAL: Guarded  CLINICAL DECISION MAKING: Unstable/unpredictable  EVALUATION COMPLEXITY: High   GOALS:  SHORT TERM GOALS: Target date: 01/09/2024  Pt will demonstrate appropriate understanding and performance of initially prescribed HEP in order to facilitate improved independence with management of symptoms.  Baseline: HEP TBD  Goal status: ONGOING  2. Pt will report at least 25% improvement in overall pain levels over past week in order to facilitate improved tolerance to typical daily activities.   Baseline: 7-10+/10 02/05/24: 10/10  Goal status: ONGOING  LONG TERM GOALS: Target date: 03/04/2024   Pt will score less than or equal to 30/50 on NDI in order to demonstrate improved perception of function due to symptoms (MDC 10-13 pts  per Neysa dunker al 2009, 2010). Baseline: 41/50 02/05/24:  Goal status: ONGOING  2. Pt will demonstrate at least 40 degrees of active cervical rotation ROM in order to demonstrate improved environmental awareness and safety with driving.  Baseline: see ROM chart above Goal status: ONGOING  3. Pt will demonstrate at least 4+/5 shoulder MMT for improved symmetry of UE strength and improved tolerance to functional movements.  Baseline: see MMT chart above Goal status: ONGOING  4. Pt will report ability to perform upper body dressing with less than 2 point increase on NPS in order to demonstrate improved tolerance to ADLs. Baseline: 10+/10 02/05/24: 10/10, requires assistance from spouse Goal status: ONGOING  5. Pt will demonstrate at least 90 deg of GH AROM BIL in order to facilitate improved functional reaching.  Baseline: see ROM chart above  Goal status: ONGOING   PLAN:  PT FREQUENCY: 1-2x/week  PT DURATION: 4 weeks, as of 02/05/2024  PLANNED INTERVENTIONS: 97164- PT Re-evaluation, 97750- Physical Performance Testing, 97110-Therapeutic exercises, 97530- Therapeutic activity, 97112- Neuromuscular re-education, 97535- Self Care, [id number]- Manual therapy, Patient/Family education, Taping, Dry Needling, Joint mobilization, Spinal mobilization, Cryotherapy, and Moist heat  PLAN FOR NEXT SESSION: update HEP, consider manual therapy, KT tape, Emphasis on comfortable muscle contractions, pain modulation, tolerable ROM. Symptom modification strategies as indicated/appropriate.    Marko Molt, PT, DPT  02/05/2024 10:25 AM    For all possible CPT codes, reference the Planned Interventions line above.     Check all conditions that are expected to impact treatment: {Conditions expected to impact treatment:Musculoskeletal disorders, Neurological condition and/or seizures, and Social determinants of health

## 2024-02-06 ENCOUNTER — Other Ambulatory Visit: Payer: Self-pay

## 2024-02-06 ENCOUNTER — Emergency Department (HOSPITAL_BASED_OUTPATIENT_CLINIC_OR_DEPARTMENT_OTHER)
Admission: EM | Admit: 2024-02-06 | Discharge: 2024-02-06 | Discharge status: Against Medical Advice | Attending: Emergency Medicine | Admitting: Emergency Medicine

## 2024-02-06 DIAGNOSIS — Z5321 Procedure and treatment not carried out due to patient leaving prior to being seen by health care provider: Secondary | ICD-10-CM | POA: Insufficient documentation

## 2024-02-06 DIAGNOSIS — M25512 Pain in left shoulder: Secondary | ICD-10-CM | POA: Diagnosis present

## 2024-02-06 NOTE — ED Notes (Signed)
 Called x 3, unable to locate pt

## 2024-02-06 NOTE — ED Triage Notes (Signed)
 Reports shoulder pain that has progressively gotten worse over the last two weeks. Has been dealing with pain for 7 months. Denies injury to area. Has had steroid injections in past that helped. Hx of spinal fusion and reports needing mri.   Unable to move left arm.

## 2024-02-06 NOTE — ED Notes (Signed)
 Called for room x3. Not seen in lobby.

## 2024-02-12 ENCOUNTER — Ambulatory Visit

## 2024-02-12 DIAGNOSIS — M542 Cervicalgia: Secondary | ICD-10-CM

## 2024-02-12 DIAGNOSIS — R293 Abnormal posture: Secondary | ICD-10-CM

## 2024-02-12 NOTE — Therapy (Addendum)
 OUTPATIENT PHYSICAL THERAPY NOTE + DISCHARGE (see below)   Patient Name: Suzanne Fisher MRN: [id number] DOB:[birth year], 53 y.o., female Today's Date: 02/12/2024  END OF SESSION:  PT End of Session - 02/12/24 0910     Visit Number 4    Number of Visits 7    Date for PT Re-Evaluation 03/04/24    Authorization Type MCD    Authorization Time Period Submitted for 12 visits    PT Start Time 0915    PT Stop Time 0955    PT Time Calculation (min) 40 min    Activity Tolerance No increased pain;Patient limited by pain    Behavior During Therapy [facility] for tasks assessed/performed          Past Medical History:  Diagnosis Date   H/O degenerative disc disease    Lupus    MS (multiple sclerosis) (HCC)    Past Surgical History:  Procedure Laterality Date   CERVICAL FUSION     CESAREAN SECTION     TEMPOROMANDIBULAR JOINT SURGERY     There are no active problems to display for this patient.   PCP: No PCP in chart  REFERRING PROVIDER: Joshua Alm Hamilton, MD  REFERRING DIAG: [id number] (ICD-10-CM) - Radiculopathy of cervical region  THERAPY DIAG:  Cervicalgia  Abnormal posture  Rationale for Evaluation and Treatment: Rehabilitation  ONSET DATE: chronic, worsening over past year  SUBJECTIVE:                                                                                                                                                                                                         SUBJECTIVE STATEMENT: Patient reports that she went to the ER last Thursday without being seen after 6+ hours of waiting. She went to urgent care and had an xray which was unremarkable. She states that over the past few days her pain has gotten much worse on the Lt side and increased numbness and tingling down to her fingertips. She has an especially hard time sleeping due to the pain, N/T. She is also having some pain down the RUE as well. She also states that she felt worse after the last  session even with the gentle band exercises and PROM. She states she sees her MD next Tuesday.    Pt endorses longstanding history of neck/UE pain, prior cervical fusion 2015. States she had PT before and after, has also had bout of PT around 2020 for similar symptoms. States this episode is the same kind of symptoms but with significant increase in intensity over past year, particularly  last six months. She states she was told she needed PT before she can get an MRI, states PT has not been very helpful in past. She describes shooting pains and numbness in either UE, typically R>L although today is more L than R. Has been doing exercises from prior bouts of PT. She states she has a nerve conduction study test coming up.   Hand dominance: Right  PERTINENT HISTORY:   lupus, MS, hx cervical fusion 2015, hx migraines since high school  PAIN:  Are you having pain: 10+/10 Location/description: BIL neck pain Best-worst over past week: 7-10/10  - aggravating factors: upper body dressing, raising arm, carrying objects  - Easing factors: none (has tried ice, heat, pain patches, tens)     PRECAUTIONS: hx fusion  RED FLAGS: No diplopia, no swallowing difficulties, no changes in speech, denies recent changes in migraines. Gait WNL. She does endorse that at times pain will get so bad she feels nauseous.     WEIGHT BEARING RESTRICTIONS: No  FALLS:  Has patient fallen in last 6 months? No  LIVING ENVIRONMENT: Lives w/ 53 y.o, 2 story home. 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  DIAGNOSTIC FINDINGS:  No recent imaging in chart - pt states she has had recent chest XR that showed impingement of her spine.  States orthopedist told her she may need a disc replacement and referred to neurosurgery  PATIENT SURVEYS:  NDI: 41/50; 82%    02/05/24: 42/50: 84%  COGNITION: Overall cognitive status: Within functional limits for tasks assessed  SENSATION: Reduced sensation L C4, C5, C7 dermatomes compared to R; intact otherwise Negative hoffman/tromner sign BIL ; pt does remark manipulation of L hand sparks posterior L shoulder pain  POSTURE: very guarded UT posture, L>R  PALPATION: Deferred given symptom irritability   CERVICAL ROM:   ROM A/PROM (deg) eval AROM  Flexion    Extension 5 deg, p! In B UE   Right lateral flexion    Left lateral flexion    Right rotation 28 deg  28 deg  Left rotation 30 deg * 25 deg   (Blank rows = not tested) (Key: WFL = within functional limits not formally assessed, * = concordant pain, s = stiffness/stretching sensation, NT = not tested) Comment:   UPPER EXTREMITY ROM:  A/PROM Right eval Left eval Right 02/05/2024 Left 02/05/2024  Shoulder flexion At least 90 deg observation 44 deg * 90 deg 60 deg  Shoulder abduction      Shoulder internal rotation      Shoulder external rotation      Elbow flexion      Elbow extension      Wrist flexion      Wrist extension       (Blank rows = not tested) (Key: WFL = within functional limits not formally assessed, * = concordant pain, s = stiffness/stretching sensation, NT = not tested)  Comments:    UPPER EXTREMITY MMT:  MMT Right eval Left eval  Shoulder flexion    Shoulder extension    Shoulder abduction    Shoulder extension    Shoulder internal rotation    Shoulder external rotation    Elbow flexion    Elbow  extension    Grip strength    (Blank rows = not tested)  (Key: WFL = within functional limits not formally assessed, * = concordant pain, s = stiffness/stretching sensation, NT = not tested)  Comments: deferred given symptom irritability   CERVICAL SPECIAL TESTS:  Unable to test given  symptom irritability   FUNCTIONAL TESTS:  Overhead reaching limited as above  TREATMENT DATE:  [facility] Adult PT Treatment:                                                DATE: 02/12/24 Self Care/Home Management  Pain management strategies, returning to MD regarding worsening of symptoms, decrease in function, increase in pain and lack of progress with PT.        [facility] Adult PT Treatment:                                                DATE: 02/05/24   Reassessment of objective measures and subjective assessment regarding progress towards established goals and updated plan for addressing remaining deficits and rehab goals.     [facility] Adult PT Treatment:                                                DATE: 01/09/2024  Therapeutic Exercise: PROM cervical rotation, sidebending, shoulder depressing  Cervical retraction, x 5 (best tolerated with cervical extension)  Cervical side bending 2 x 10 sec each side (request discontinuing d/t TS and LS stretching)  Seated scapular retraction  Seated resisted shoulder ER bilater with red TB, poor tolerance  Seated resisted shoulder horizontal abduction with red TB, small arc                                                                                                                         PATIENT EDUCATION:  Education details: Pt education on PT impairments, prognosis, and POC. Informed consent. Rationale for interventions Person educated: Patient Education method: Explanation, Demonstration, Tactile cues, Verbal cues Education comprehension: verbalized understanding, returned demonstration, verbal cues required, tactile cues required, and needs further education    HOME EXERCISE PROGRAM: Provided at first f/u    ASSESSMENT:  CLINICAL IMPRESSION: Patient presents to PT reporting worsening of symptoms after last session and particularly over the last 4 days. She describes searing pain from the Lt side of her neck down to her fingertips and N/T  as well. She also states that she is noticing more symptoms on the Rt side as well. She notes that her function has been decreasing over the past few days and  that she is unable to dress or perform another ADLs without assistance. After consulting with evaluating therapist, advised patient that retuning to her MD for additional work up would be beneficial as she has not had any improvements since beginning PT and has regressed more. Will D/C PT at this time due to lack of progress and continued regression of function and increase in pain.  Eval: Patient is a 53 y.o. woman who was seen today for physical therapy evaluation and treatment for neck pain/UE pain. Pt endorses significant limitations in daily/work activities involving cervical/UE movements. Red flag questioning is overall reassuring although symptoms are highly irritable which limits exam today. She does demonstrate significant limitations in cervical and GH mobility, impaired sensory exam. HEP is deferred given high symptom irritability with limited exam, no adverse events, pt denies increase in symptoms on departure. Recommend trial of skilled PT to address aforementioned deficits with aim of improving functional tolerance and reducing pain with typical activities. Pt departs today's session in no acute distress, all voiced concerns/questions addressed appropriately from PT perspective.    OBJECTIVE IMPAIRMENTS: decreased activity tolerance, decreased endurance, decreased mobility, decreased ROM, decreased strength, impaired perceived functional ability, impaired flexibility, impaired sensation, impaired UE functional use, postural dysfunction, and pain.   ACTIVITY LIMITATIONS: carrying, lifting, sitting, bathing, dressing, reach over head, and hygiene/grooming  PARTICIPATION LIMITATIONS: meal prep, cleaning, laundry, driving, shopping, community activity, and occupation  PERSONAL FACTORS: Time since onset of injury/illness/exacerbation and 3+  comorbidities: lupus, MS, hx cervical fusion are also affecting patient's functional outcome.   REHAB POTENTIAL: Guarded  CLINICAL DECISION MAKING: Unstable/unpredictable  EVALUATION COMPLEXITY: High   GOALS:  SHORT TERM GOALS: Target date: 01/09/2024  Pt will demonstrate appropriate understanding and performance of initially prescribed HEP in order to facilitate improved independence with management of symptoms.  Baseline: HEP TBD  Goal status: ONGOING  2. Pt will report at least 25% improvement in overall pain levels over past week in order to facilitate improved tolerance to typical daily activities.   Baseline: 7-10+/10 02/05/24: 10/10  Goal status: ONGOING  LONG TERM GOALS: Target date: 03/04/2024   Pt will score less than or equal to 30/50 on NDI in order to demonstrate improved perception of function due to symptoms ([facility] 10-13 pts per Neysa dunker al 2009, 2010). Baseline: 41/50 02/05/24:  Goal status: ONGOING  2. Pt will demonstrate at least 40 degrees of active cervical rotation ROM in order to demonstrate improved environmental awareness and safety with driving.  Baseline: see ROM chart above Goal status: ONGOING  3. Pt will demonstrate at least 4+/5 shoulder MMT for improved symmetry of UE strength and improved tolerance to functional movements.  Baseline: see MMT chart above Goal status: ONGOING  4. Pt will report ability to perform upper body dressing with less than 2 point increase on NPS in order to demonstrate improved tolerance to ADLs. Baseline: 10+/10 02/05/24: 10/10, requires assistance from spouse Goal status: ONGOING  5. Pt will demonstrate at least 90 deg of GH AROM BIL in order to facilitate improved functional reaching.  Baseline: see ROM chart above  Goal status: ONGOING   PLAN:  PT FREQUENCY: 1-2x/week  PT DURATION: 4 weeks, as of 02/05/2024  PLANNED INTERVENTIONS: 97164- PT Re-evaluation, 97750- Physical Performance Testing, 97110-Therapeutic  exercises, 97530- Therapeutic activity, 97112- Neuromuscular re-education, 97535- Self Care, [id number]- Manual therapy, Patient/Family education, Taping, Dry Needling, Joint mobilization, Spinal mobilization, Cryotherapy, and Moist heat  PLAN FOR NEXT SESSION: update HEP, consider manual therapy, KT tape, Emphasis  on comfortable muscle contractions, pain modulation, tolerable ROM. Symptom modification strategies as indicated/appropriate.    Corean Pouch PTA  02/12/2024 9:49 AM    For all possible CPT codes, reference the Planned Interventions line above.     Check all conditions that are expected to impact treatment: {Conditions expected to impact treatment:Musculoskeletal disorders, Neurological condition and/or seizures, and Social determinants of health     Discharge addendum 04/27/2024  PHYSICAL THERAPY DISCHARGE SUMMARY  Visits from Start of Care: 4  Current functional level related to goals / functional outcomes: See above   Remaining deficits: See above   Education / Equipment: See above  Patient goals were unable to be fully assessed. Patient agrees to discharge. Patient is being discharged due to lack of progress, worsening of symptoms.  Alm Suzanne Fisher PT, DPT 04/27/2024 4:06 PM

## 2024-02-19 ENCOUNTER — Ambulatory Visit

## 2024-02-26 ENCOUNTER — Encounter: Admitting: Physical Therapy

## 2024-03-04 ENCOUNTER — Encounter
